# Patient Record
Sex: Female | Born: 1975 | Hispanic: No | Marital: Married
Health system: Northeastern US, Academic
[De-identification: ages and names within clinical notes are randomized; demographics above are authoritative.]

## PROBLEM LIST (undated history)

## (undated) DIAGNOSIS — I10 Essential (primary) hypertension: Secondary | ICD-10-CM

## (undated) HISTORY — PX: ABDOMINAL HYSTERECTOMY: SHX81

## (undated) HISTORY — PX: EYE SURGERY: SHX253

---

## 2014-03-14 ENCOUNTER — Emergency Department (HOSPITAL_BASED_OUTPATIENT_CLINIC_OR_DEPARTMENT_OTHER)
Admission: EM | Admit: 2014-03-14 | Discharge: 2014-03-14 | Disposition: A | Discharge status: Home / Self Care | Payer: Self-pay | Attending: Emergency Medicine | Admitting: Emergency Medicine

## 2014-03-14 ENCOUNTER — Encounter (HOSPITAL_BASED_OUTPATIENT_CLINIC_OR_DEPARTMENT_OTHER): Payer: Self-pay | Admitting: Emergency Medicine

## 2014-03-14 DIAGNOSIS — Y9301 Activity, walking, marching and hiking: Secondary | ICD-10-CM | POA: Insufficient documentation

## 2014-03-14 DIAGNOSIS — W268XXA Contact with other sharp object(s), not elsewhere classified, initial encounter: Secondary | ICD-10-CM | POA: Insufficient documentation

## 2014-03-14 DIAGNOSIS — Z79899 Other long term (current) drug therapy: Secondary | ICD-10-CM | POA: Insufficient documentation

## 2014-03-14 DIAGNOSIS — Y929 Unspecified place or not applicable: Secondary | ICD-10-CM | POA: Insufficient documentation

## 2014-03-14 DIAGNOSIS — S91309A Unspecified open wound, unspecified foot, initial encounter: Secondary | ICD-10-CM | POA: Insufficient documentation

## 2014-03-14 DIAGNOSIS — I10 Essential (primary) hypertension: Secondary | ICD-10-CM | POA: Insufficient documentation

## 2014-03-14 DIAGNOSIS — S91311A Laceration without foreign body, right foot, initial encounter: Secondary | ICD-10-CM

## 2014-03-14 HISTORY — DX: Essential (primary) hypertension: I10

## 2014-03-14 NOTE — Discharge Instructions (Signed)
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
## 2014-03-14 NOTE — ED Notes (Signed)
Pt foot cleaned and bandaged with bacitracin and 2x2

## 2014-03-24 NOTE — ED Provider Notes (Signed)
Medical screening examination/treatment/procedure(s) were performed by non-physician practitioner and as supervising physician I was immediately available for consultation/collaboration.   EKG Interpretation None        Rolland PorterMark Kanishk Stroebel, MD 03/24/14 2354

## 2014-08-29 ENCOUNTER — Emergency Department (HOSPITAL_COMMUNITY): Payer: Self-pay

## 2014-08-29 ENCOUNTER — Encounter (HOSPITAL_COMMUNITY): Payer: Self-pay | Admitting: Emergency Medicine

## 2014-08-29 ENCOUNTER — Emergency Department (HOSPITAL_COMMUNITY)
Admission: EM | Admit: 2014-08-29 | Discharge: 2014-08-30 | Disposition: A | Discharge status: Home / Self Care | Payer: Self-pay | Attending: Emergency Medicine | Admitting: Emergency Medicine

## 2014-08-29 DIAGNOSIS — F419 Anxiety disorder, unspecified: Secondary | ICD-10-CM | POA: Insufficient documentation

## 2014-08-29 DIAGNOSIS — R51 Headache: Secondary | ICD-10-CM | POA: Insufficient documentation

## 2014-08-29 DIAGNOSIS — I10 Essential (primary) hypertension: Secondary | ICD-10-CM | POA: Insufficient documentation

## 2014-08-29 DIAGNOSIS — Z79899 Other long term (current) drug therapy: Secondary | ICD-10-CM | POA: Insufficient documentation

## 2014-08-29 DIAGNOSIS — R519 Headache, unspecified: Secondary | ICD-10-CM

## 2014-08-29 LAB — CBG MONITORING, ED: Glucose-Capillary: 79 mg/dL (ref 70–99)

## 2014-08-29 MED ORDER — DIPHENHYDRAMINE HCL 50 MG/ML IJ SOLN
25.0000 mg | Freq: Once | INTRAMUSCULAR | Status: AC
  Administered 2014-08-29: 25 mg via INTRAVENOUS
  Filled 2014-08-29: qty 1

## 2014-08-29 MED ORDER — LISINOPRIL-HYDROCHLOROTHIAZIDE 10-12.5 MG PO TABS: 1.0000 | ORAL_TABLET | Freq: Every day | ORAL | Status: DC

## 2014-08-29 MED ORDER — SODIUM CHLORIDE 0.9 % IV BOLUS (SEPSIS)
1000.0000 mL | Freq: Once | INTRAVENOUS | Status: AC
  Administered 2014-08-29: 1000 mL via INTRAVENOUS

## 2014-08-29 MED ORDER — PROMETHAZINE HCL 25 MG/ML IJ SOLN
25.0000 mg | Freq: Once | INTRAMUSCULAR | Status: AC
  Administered 2014-08-29: 25 mg via INTRAVENOUS
  Filled 2014-08-29: qty 1

## 2014-08-29 MED ORDER — KETOROLAC TROMETHAMINE 30 MG/ML IJ SOLN
30.0000 mg | Freq: Once | INTRAMUSCULAR | Status: AC
  Administered 2014-08-29: 30 mg via INTRAVENOUS
  Filled 2014-08-29: qty 1

## 2014-08-29 NOTE — ED Notes (Signed)
Pt. reports persistent headache for 1 week with mild nausea and photophobia  , denies injury , alert and oriented .

## 2014-08-29 NOTE — ED Notes (Signed)
Pt states that she was diagnosed with muscle spasms [place], but has recently moved here and has not followed up. Pt states that she has been having muscle spasms in her neck x months. Pt states that the pain in her head is worse than normal. Pt also states that she has not taken her BP meds today.

## 2014-08-29 NOTE — ED Provider Notes (Signed)
CSN: [id number]     Arrival date & time 08/29/14  2055 History   First MD Initiated Contact with Patient 08/29/14 2104     Chief Complaint  Patient presents with  . Headache     (Consider location/radiation/quality/duration/timing/severity/associated sxs/prior Treatment) HPI   HPI: 38 y.o female w PMH of HTN, neck spasms, c/o headache and left sided neck pain. Pt reports b/l temporal, intermittent sharp pains in a band-like fashion around her head that is worse this week. Pt has been experiencing similar headaches for several months but with progressive increased frequency. Associated symptoms include tinnitus, photosensitivity, and possible aura described as sparkly white lights and blurred vision. Denies fevers, chills, syncope, recent trauma, hospitalizations. Reports left sided neck spasms since January 2015 for which she received steroid injections and PT with some relief. Pt recently moved and has been unable to see a family physician. She confided some increased stress in the last several months after leaving her husband with responsibility to care for two children. She requests refills on HCTZ-Lisinopril for HTN. She did not take her antihypertensive medicine today.    Past Medical History  Diagnosis Date  . Hypertension    Past Surgical History  Procedure Laterality Date  . Abdominal hysterectomy    . Eye surgery     No family history on file. History  Substance Use Topics  . Smoking status: Never Smoker   . Smokeless tobacco: Not on file  . Alcohol Use: No   OB History    No data available     Review of Systems  10 Systems reviewed and are negative for acute change except as noted in the HPI.    Allergies  Review of patient's allergies indicates no known allergies.  Home Medications   Prior to Admission medications   Medication Sig Start Date End Date Taking? Authorizing Provider  ibuprofen (ADVIL) 200 MG tablet Take 400 mg by mouth every 6 (six) hours as  needed for headache.   Yes Historical Provider, MD  lisinopril-hydrochlorothiazide (PRINZIDE,ZESTORETIC) 10-12.5 MG per tablet Take 1 tablet by mouth daily.   Yes Historical Provider, MD   BP 134/84 mmHg  Pulse 65  Temp(Src) 98 F (36.7 C) (Oral)  Resp 18  Ht 5\' 4"  (1.626 m)  Wt 145 lb (65.772 kg)  BMI 24.88 kg/m2  SpO2 97% Physical Exam  Constitutional: She appears well-developed and well-nourished. No distress.  HENT:  Head: Normocephalic and atraumatic.  Right Ear: Tympanic membrane and ear canal normal.  Left Ear: Tympanic membrane and ear canal normal.  Nose: Nose normal.  Mouth/Throat: Uvula is midline, oropharynx is clear and moist and mucous membranes are normal.  Eyes: Conjunctivae and lids are normal. Pupils are equal, round, and reactive to light.  Neck: Normal range of motion. Neck supple.  Cardiovascular: Normal rate and regular rhythm.   Pulmonary/Chest: Effort normal.  Abdominal: Soft.  Neurological: She is alert.  Cranial nerves II-VIII and X-XII evaluated and show no deficits. Pt alert and oriented x 3 Upper and lower extremity strength is symmetrical and physiologic Normal muscular tone No facial droop Coordination intact, no limb ataxia, finger-nose-finger normal Rapid alternating movements normal No pronator drift  Skin: Skin is warm and dry.  Nursing note and vitals reviewed.   ED Course  Procedures (including critical care time) Labs Review Labs Reviewed  CBG MONITORING, ED    Imaging Review No results found.   EKG Interpretation None      MDM   Final diagnoses:  Headache   Chronic tension-like headache with acute worsening x 1 week, h/o neck spasms. Denied trauma, fevers/chills, vomiting. Normal neurologic exam, no nuchal rigidity. Pt was very anxious and concerned about her headache and requests Head CT. A CT Head w/o contrast was ordered and revealed no acute intracranial abnormalities. Pt relieved and friend at bedside has copy of  results of CT scan. Migraine cocktail provided relief from pain and nausea. BP and HR improved during her stay. Will d/c with refill to HCTZ-Lisinopril 10-12.5mg  per pt request.   Medications  diphenhydrAMINE (BENADRYL) injection 25 mg (25 mg Intravenous Given 08/29/14 2300)  ketorolac (TORADOL) 30 MG/ML injection 30 mg (30 mg Intravenous Given 08/29/14 2303)  sodium chloride 0.9 % bolus 1,000 mL (1,000 mLs Intravenous New Bag/Given 08/29/14 2250)  promethazine (PHENERGAN) injection 25 mg (25 mg Intravenous Given 08/29/14 2257)   12:11am Benadryl and Phenergan has sedated patient. She was able to get up and ambulate to the bathroom but is sleepy. Vital signs remain stable. O2 in room is 100%, pulse is 80, BP is 134/84.  Will monitor for 1 more hour and discharge if vitals remain stable.  12: 38 am The patient denies any symptoms of neurological impairment or TIA's; no amaurosis, diplopia, dysphasia, or unilateral disturbance of motor or sensory function. No loss of balance or vertigo. Nurse ambulated pt with minimal assistance to bathroom  38 y.o.Bettina Christley's evaluation in the Emergency Department is complete. It has been determined that no acute conditions requiring further emergency intervention are present at this time. The patient/guardian have been advised of the diagnosis and plan. We have discussed signs and symptoms that warrant return to the ED, such as changes or worsening in symptoms.  Vital signs are stable at discharge. Filed Vitals:   08/29/14 2230  BP: 134/84  Pulse: 65  Temp:   Resp:     Patient/guardian has voiced understanding and agreed to follow-up with the PCP or specialist.   Dorthula Matasiffany G Dominico Rod, PA-C 08/30/14 [phone]  Elwin MochaBlair Walden, MD 08/30/14 [phone]

## 2016-04-14 ENCOUNTER — Emergency Department (HOSPITAL_COMMUNITY): Payer: BLUE CROSS/BLUE SHIELD

## 2016-04-14 ENCOUNTER — Emergency Department (HOSPITAL_COMMUNITY)
Admission: EM | Admit: 2016-04-14 | Discharge: 2016-04-14 | Disposition: A | Discharge status: Home / Self Care | Payer: BLUE CROSS/BLUE SHIELD | Attending: Emergency Medicine | Admitting: Emergency Medicine

## 2016-04-14 ENCOUNTER — Encounter (HOSPITAL_COMMUNITY): Payer: Self-pay | Admitting: Emergency Medicine

## 2016-04-14 DIAGNOSIS — R51 Headache: Secondary | ICD-10-CM | POA: Diagnosis not present

## 2016-04-14 DIAGNOSIS — Z79899 Other long term (current) drug therapy: Secondary | ICD-10-CM | POA: Diagnosis not present

## 2016-04-14 DIAGNOSIS — I10 Essential (primary) hypertension: Secondary | ICD-10-CM | POA: Diagnosis not present

## 2016-04-14 DIAGNOSIS — R519 Headache, unspecified: Secondary | ICD-10-CM

## 2016-04-14 LAB — CBC WITH DIFFERENTIAL/PLATELET
BASOS PCT: 1 %
Basophils Absolute: 0 10*3/uL (ref 0.0–0.1)
Eosinophils Absolute: 0.1 10*3/uL (ref 0.0–0.7)
Eosinophils Relative: 1 %
HCT: 41.5 % (ref 36.0–46.0)
HEMOGLOBIN: 13.8 g/dL (ref 12.0–15.0)
LYMPHS ABS: 2.2 10*3/uL (ref 0.7–4.0)
Lymphocytes Relative: 33 %
MCH: 28.7 pg (ref 26.0–34.0)
MCHC: 33.3 g/dL (ref 30.0–36.0)
MCV: 86.3 fL (ref 78.0–100.0)
Monocytes Absolute: 0.5 10*3/uL (ref 0.1–1.0)
Monocytes Relative: 7 %
NEUTROS PCT: 58 %
Neutro Abs: 3.8 10*3/uL (ref 1.7–7.7)
Platelets: 247 10*3/uL (ref 150–400)
RBC: 4.81 MIL/uL (ref 3.87–5.11)
RDW: 13 % (ref 11.5–15.5)
WBC: 6.7 10*3/uL (ref 4.0–10.5)

## 2016-04-14 LAB — BASIC METABOLIC PANEL
Anion gap: 8 (ref 5–15)
BUN: 12 mg/dL (ref 6–20)
CHLORIDE: 99 mmol/L — AB (ref 101–111)
CO2: 30 mmol/L (ref 22–32)
CREATININE: 0.92 mg/dL (ref 0.44–1.00)
Calcium: 9.3 mg/dL (ref 8.9–10.3)
GFR calc non Af Amer: >60 mL/min (ref >60)
Glucose, Bld: 113 mg/dL — ABNORMAL HIGH (ref 65–99)
POTASSIUM: 3 mmol/L — AB (ref 3.5–5.1)
SODIUM: 137 mmol/L (ref 135–145)

## 2016-04-14 MED ORDER — KETOROLAC TROMETHAMINE 30 MG/ML IJ SOLN
30.0000 mg | Freq: Once | INTRAMUSCULAR | Status: AC
  Administered 2016-04-14: 30 mg via INTRAVENOUS
  Filled 2016-04-14: qty 1

## 2016-04-14 MED ORDER — TRAMADOL HCL 50 MG PO TABS: 50.0000 mg | ORAL_TABLET | Freq: Four times a day (QID) | ORAL | Status: DC | PRN

## 2016-04-14 MED ORDER — METOCLOPRAMIDE HCL 5 MG/ML IJ SOLN
10.0000 mg | Freq: Once | INTRAMUSCULAR | Status: AC
  Administered 2016-04-14: 10 mg via INTRAVENOUS
  Filled 2016-04-14: qty 2

## 2016-04-14 MED ORDER — DIPHENHYDRAMINE HCL 50 MG/ML IJ SOLN
25.0000 mg | Freq: Once | INTRAMUSCULAR | Status: AC
  Administered 2016-04-14: 25 mg via INTRAVENOUS
  Filled 2016-04-14: qty 1

## 2016-04-14 MED ORDER — TRAMADOL HCL 50 MG PO TABS
50.0000 mg | ORAL_TABLET | Freq: Four times a day (QID) | ORAL | Status: DC | PRN
Start: 2016-04-14 — End: 2018-06-09

## 2016-04-14 NOTE — ED Notes (Signed)
Pt states she continues to feel the pressure of her headache.  Rates pain 5/10.  Awaiting CT scan.

## 2016-04-14 NOTE — Discharge Instructions (Signed)
Follow up with your family md next week for recheck °

## 2016-04-14 NOTE — ED Notes (Signed)
Has had right sided headache for two weeks constant which waxes and wanes.  Has HTN, working on changing meds.  Also is taking amoxil for right otitis.  + photosensitive.  + nausea

## 2016-04-14 NOTE — ED Provider Notes (Signed)
CSN: [id number]     Arrival date & time 04/14/16  0903 History   First MD Initiated Contact with Patient 04/14/16 [phone]     Chief Complaint  Patient presents with  . Headache     (Consider location/radiation/quality/duration/timing/severity/associated sxs/prior Treatment) Patient is a 40 y.o. female presenting with headaches. The history is provided by the patient (Patient complains of a headache. And she is on medicine for blood pressure).  Headache Pain location:  Frontal Quality:  Dull Radiates to:  Does not radiate Severity currently:  7/10 Severity at highest:  9/10 Onset quality:  Sudden Timing:  Constant Progression:  Worsening Chronicity:  Recurrent Context: not activity   Associated symptoms: no abdominal pain, no back pain, no congestion, no cough, no diarrhea, no fatigue, no seizures and no sinus pressure     Past Medical History  Diagnosis Date  . Hypertension    Past Surgical History  Procedure Laterality Date  . Abdominal hysterectomy    . Eye surgery     History reviewed. No pertinent family history. Social History  Substance Use Topics  . Smoking status: Never Smoker   . Smokeless tobacco: None  . Alcohol Use: Yes     Comment: occasional-1-2 every 2 weeks   OB History    No data available     Review of Systems  Constitutional: Negative for appetite change and fatigue.  HENT: Negative for congestion, ear discharge and sinus pressure.   Eyes: Negative for discharge.  Respiratory: Negative for cough.   Cardiovascular: Negative for chest pain.  Gastrointestinal: Negative for abdominal pain and diarrhea.  Genitourinary: Negative for frequency and hematuria.  Musculoskeletal: Negative for back pain.  Skin: Negative for rash.  Neurological: Positive for headaches. Negative for seizures.  Psychiatric/Behavioral: Negative for hallucinations.      Allergies  Review of patient's allergies indicates no known allergies.  Home Medications   Prior to  Admission medications   Medication Sig Start Date End Date Taking? Authorizing Provider  ibuprofen (ADVIL) 200 MG tablet Take 400 mg by mouth every 6 (six) hours as needed for headache.    Historical Provider, MD  lisinopril-hydrochlorothiazide (PRINZIDE,ZESTORETIC) 10-12.5 MG per tablet Take 1 tablet by mouth daily. 08/29/14   Marlon Peliffany Greene, PA-C  traMADol (ULTRAM) 50 MG tablet Take 1 tablet (50 mg total) by mouth every 6 (six) hours as needed. 04/14/16   Bethann BerkshireJoseph Mayco Walrond, MD   BP 145/100 mmHg  Pulse 70  Temp(Src) 99 F (37.2 C) (Oral)  Resp 18  Wt 165 lb (74.844 kg)  SpO2 100% Physical Exam  Constitutional: She is oriented to person, place, and time. She appears well-developed.  HENT:  Head: Normocephalic.  Eyes: Conjunctivae and EOM are normal. No scleral icterus.  Neck: Neck supple. No thyromegaly present.  Cardiovascular: Normal rate and regular rhythm.  Exam reveals no gallop and no friction rub.   No murmur heard. Pulmonary/Chest: No stridor. She has no wheezes. She has no rales. She exhibits no tenderness.  Abdominal: She exhibits no distension. There is no tenderness. There is no rebound.  Musculoskeletal: Normal range of motion. She exhibits no edema.  Lymphadenopathy:    She has no cervical adenopathy.  Neurological: She is oriented to person, place, and time. She exhibits normal muscle tone. Coordination normal.  Skin: No rash noted. No erythema.  Psychiatric: She has a normal mood and affect. Her behavior is normal.    ED Course  Procedures (including critical care time) Labs Review Labs Reviewed  BASIC METABOLIC  PANEL - Abnormal; Notable for the following:    Potassium 3.0 (*)    Chloride 99 (*)    Glucose, Bld 113 (*)    All other components within normal limits  CBC WITH DIFFERENTIAL/PLATELET    Imaging Review Ct Head Wo Contrast  04/14/2016  CLINICAL DATA:  Headaches and nausea for 2 weeks, initial encounter EXAM: CT HEAD WITHOUT CONTRAST TECHNIQUE: Contiguous  axial images were obtained from the base of the skull through the vertex without intravenous contrast. COMPARISON:  08/29/2014 FINDINGS: The bony calvarium is intact. The ventricles are of normal size and configuration. No findings to suggest acute hemorrhage, acute infarction or space-occupying mass lesion are noted. IMPRESSION: No acute intracranial abnormality noted. Electronically Signed   By: Alcide CleverMark  Lukens M.D.   On: 04/14/2016 12:49   I have personally reviewed and evaluated these images and lab results as part of my medical decision-making.   EKG Interpretation None      MDM   Final diagnoses:  Headache disorder  Essential hypertension    Labs and CT scan unremarkable. Patient improved with treatment. Patient blood pressure remained moderately elevated. She will follow-up with her family doctor next week for recheck. Patient given some Ultram for discomfort    Bethann BerkshireJoseph Kieth Hartis, MD 04/14/16 1354

## 2016-08-10 ENCOUNTER — Other Ambulatory Visit: Payer: Self-pay

## 2016-08-10 DIAGNOSIS — N644 Mastodynia: Secondary | ICD-10-CM

## 2018-03-12 ENCOUNTER — Encounter (HOSPITAL_COMMUNITY): Payer: Self-pay | Admitting: Emergency Medicine

## 2018-03-12 ENCOUNTER — Emergency Department (HOSPITAL_COMMUNITY)
Admission: EM | Admit: 2018-03-12 | Discharge: 2018-03-12 | Disposition: A | Discharge status: Home / Self Care | Payer: BLUE CROSS/BLUE SHIELD | Attending: Emergency Medicine | Admitting: Emergency Medicine

## 2018-03-12 ENCOUNTER — Other Ambulatory Visit: Payer: Self-pay

## 2018-03-12 DIAGNOSIS — M545 Low back pain, unspecified: Secondary | ICD-10-CM

## 2018-03-12 DIAGNOSIS — I10 Essential (primary) hypertension: Secondary | ICD-10-CM | POA: Insufficient documentation

## 2018-03-12 DIAGNOSIS — G8929 Other chronic pain: Secondary | ICD-10-CM

## 2018-03-12 DIAGNOSIS — Z79899 Other long term (current) drug therapy: Secondary | ICD-10-CM | POA: Insufficient documentation

## 2018-03-12 MED ORDER — NAPROXEN 500 MG PO TABS: 500.0000 mg | ORAL_TABLET | Freq: Two times a day (BID) | ORAL | 0 refills | Status: AC

## 2018-03-12 MED ORDER — CYCLOBENZAPRINE HCL 5 MG PO TABS: 5.0000 mg | ORAL_TABLET | Freq: Three times a day (TID) | ORAL | 0 refills | Status: AC

## 2018-03-12 MED ORDER — CYCLOBENZAPRINE HCL 10 MG PO TABS
5.0000 mg | ORAL_TABLET | Freq: Once | ORAL | Status: AC
  Administered 2018-03-12: 5 mg via ORAL
  Filled 2018-03-12: qty 1

## 2018-03-12 NOTE — ED Triage Notes (Signed)
Had a fall in 2016 and yhesterday she started to have back pain, pain comes and goes no other I njury

## 2018-03-12 NOTE — ED Provider Notes (Signed)
[facility] EMERGENCY DEPARTMENT Provider Note  CSN: [id number] Arrival date & time: 03/12/18  1316  History   Chief Complaint Chief Complaint  Patient presents with  . Back Pain    HPI Tracie Leach is a 42 y.o. female with a medical history of HTN and low back pain who presented to the ED for back pain. Patient states she has had back pain since 2016 when she fell on her back in a grocery store. There has not been any acute changes in her back pain since then, but came to the ED to see if there was any medication that could assist in pain alleviation. Denies recent trauma or injury. She describes the pain as persistent, tight and aching. She also endorses neck pain, but it does not radiate anywhere else. Patient states that she has tried Tylenol and Naproxen in the past which has alleviated the pain, but has not used it recently. Denies fatigue, fever, arthralgias, myalgias, paresthesias, weakness, bowel or bladder dysfunction or issues with gait, balance and coordination.  Past Medical History:  Diagnosis Date  . Hypertension     There are no active problems to display for this patient.   Past Surgical History:  Procedure Laterality Date  . ABDOMINAL HYSTERECTOMY    . EYE SURGERY       OB History   None    Home Medications    Prior to Admission medications   Medication Sig Start Date End Date Taking? Authorizing Provider  cyclobenzaprine (FLEXERIL) 5 MG tablet Take 1 tablet (5 mg total) by mouth 3 (three) times daily for 15 days. 03/12/18 03/27/18  Joell Buerger, Jerrel Ivory I, PA-C  ibuprofen (ADVIL) 200 MG tablet Take 400 mg by mouth every 6 (six) hours as needed for headache.    [provider]  lisinopril-hydrochlorothiazide (PRINZIDE,ZESTORETIC) 10-12.5 MG per tablet Take 1 tablet by mouth daily. 08/29/14   Marlon Pel, PA-C  naproxen (NAPROSYN) 500 MG tablet Take 1 tablet (500 mg total) by mouth 2 (two) times daily. 03/12/18 04/11/18  Andres Escandon, Jerrel Ivory  I, PA-C  traMADol (ULTRAM) 50 MG tablet Take 1 tablet (50 mg total) by mouth every 6 (six) hours as needed. 04/14/16   Bethann Berkshire, MD  traMADol (ULTRAM) 50 MG tablet Take 1 tablet (50 mg total) by mouth every 6 (six) hours as needed. 04/14/16   Bethann Berkshire, MD    Family History No family history on file.  Social History Social History   Tobacco Use  . Smoking status: Never Smoker  . Smokeless tobacco: Never Used  Substance Use Topics  . Alcohol use: Yes    Comment: occasional-1-2 every 2 weeks  . Drug use: No     Allergies   Patient has no known allergies.   Review of Systems Review of Systems  Constitutional: Negative.  Negative for fatigue and fever.  Gastrointestinal: Negative.   Genitourinary: Negative.   Musculoskeletal: Positive for back pain and neck pain. Negative for arthralgias, gait problem, joint swelling, myalgias and neck stiffness.  Skin: Negative.  Negative for wound.  Neurological: Negative for tremors, weakness and numbness.     Physical Exam Updated Vital Signs BP 140/90 (BP Location: Right Arm)   Pulse 69   Temp 98.4 F (36.9 C) (Oral)   Resp 16   SpO2 100%   Physical Exam  Constitutional: She appears well-developed and well-nourished.  Neck: Normal range of motion. Neck supple.  Cardiovascular: Normal rate, regular rhythm, normal heart sounds and intact distal pulses.  Pulmonary/Chest: Effort normal and breath sounds normal.  Musculoskeletal:       Cervical back: She exhibits tenderness. She exhibits no bony tenderness, no deformity, no pain and no spasm.       Thoracic back: Normal.       Lumbar back: She exhibits tenderness. She exhibits normal range of motion, no bony tenderness and no deformity.  Paraspinal muscles of cervical and lumbar regions are tender to palpation. Patient has full active and passive ROM of neck, back and all 4 extremities.  Neurological: She is alert. She has normal strength and normal reflexes. No sensory  deficit. She exhibits normal muscle tone. Coordination and gait normal.  5/5 strength in upper and lower extremities bilaterally.  Nursing note and vitals reviewed.    ED Treatments / Results  Labs (all labs ordered are listed, but only abnormal results are displayed) Labs Reviewed - No data to display  EKG None  Radiology No results found.  Procedures Procedures (including critical care time)  Medications Ordered in ED Medications  cyclobenzaprine (FLEXERIL) tablet 5 mg (has no administration in time range)   Initial Impression / Assessment and Plan / ED Course  Triage vital signs and the nursing notes have been reviewed.  Pertinent labs & imaging results that were available during care of the patient were reviewed and considered in medical decision making (see chart for details).    Patient presents for chronic low back pain. She is in no acute distress, but appears uncomfortable throughout the exam. She has had no acute changes in the quality or severity of her back pain since an initial fall in 2016. No recent trauma or injury that would warrant imaging today. Denies systemic s/s or risk factors that would suggest an infectious process. Physical exam is reassuring as she has full active and passive ROM in neck, back and extremities without bony tenderness. She is able to ambulate and bear weight without issue. It is also reassuring that this pain is relieved with Tylenol and Naprosyn and patient does not have any neuro symptoms like paresthesias or weakness.   Final Clinical Impressions(s) / ED Diagnoses  1. Chronic Low Back Pain. MSK etiology. Flexeril 5mg  Q6H PRN and Naprosyn Referral to neurosurgery. Education provided on pharm and non-pharm treatment options.   Dispo: Home. After thorough clinical evaluation, this patient is determined to be medically stable and can be safely discharged with the previously mentioned treatment and/or outpatient follow-up/referral(s). At this  time, there are no other apparent medical conditions that require further screening, evaluation or treatment.  Final diagnoses:  Chronic low back pain without sciatica, unspecified back pain laterality    ED Discharge Orders        Ordered    cyclobenzaprine (FLEXERIL) 5 MG tablet  3 times daily     03/12/18 1554    naproxen (NAPROSYN) 500 MG tablet  2 times daily     03/12/18 1554       Virgina Deakins, [place], PA-C 03/12/18 1556    Tilden Fossaees, Elizabeth, MD 03/13/18 [phone]

## 2018-03-12 NOTE — Discharge Instructions (Signed)
Call neurosurgeon listed below to schedule an appointment.

## 2018-06-08 ENCOUNTER — Emergency Department (HOSPITAL_COMMUNITY): Payer: Self-pay

## 2018-06-08 ENCOUNTER — Other Ambulatory Visit: Payer: Self-pay

## 2018-06-08 ENCOUNTER — Emergency Department (HOSPITAL_COMMUNITY)
Admission: EM | Admit: 2018-06-08 | Discharge: 2018-06-09 | Disposition: A | Discharge status: Home / Self Care | Payer: Self-pay | Attending: Emergency Medicine | Admitting: Emergency Medicine

## 2018-06-08 ENCOUNTER — Encounter (HOSPITAL_COMMUNITY): Payer: Self-pay | Admitting: Emergency Medicine

## 2018-06-08 DIAGNOSIS — Z79899 Other long term (current) drug therapy: Secondary | ICD-10-CM | POA: Insufficient documentation

## 2018-06-08 DIAGNOSIS — K5900 Constipation, unspecified: Secondary | ICD-10-CM | POA: Insufficient documentation

## 2018-06-08 DIAGNOSIS — R1032 Left lower quadrant pain: Secondary | ICD-10-CM

## 2018-06-08 DIAGNOSIS — I10 Essential (primary) hypertension: Secondary | ICD-10-CM | POA: Insufficient documentation

## 2018-06-08 LAB — URINALYSIS, ROUTINE W REFLEX MICROSCOPIC
Bilirubin Urine: NEGATIVE
GLUCOSE, UA: NEGATIVE mg/dL
Hgb urine dipstick: NEGATIVE
Ketones, ur: NEGATIVE mg/dL
Nitrite: NEGATIVE
Protein, ur: NEGATIVE mg/dL
SPECIFIC GRAVITY, URINE: 1.023 (ref 1.005–1.030)
pH: 6 (ref 5.0–8.0)

## 2018-06-08 LAB — COMPREHENSIVE METABOLIC PANEL
ALT: 16 U/L (ref 0–44)
AST: 21 U/L (ref 15–41)
Albumin: 4.2 g/dL (ref 3.5–5.0)
Alkaline Phosphatase: 68 U/L (ref 38–126)
Anion gap: 11 (ref 5–15)
BUN: 12 mg/dL (ref 6–20)
CO2: 28 mmol/L (ref 22–32)
CREATININE: 0.92 mg/dL (ref 0.44–1.00)
Calcium: 9.7 mg/dL (ref 8.9–10.3)
Chloride: 99 mmol/L (ref 98–111)
Glucose, Bld: 100 mg/dL — ABNORMAL HIGH (ref 70–99)
Potassium: 3.7 mmol/L (ref 3.5–5.1)
Sodium: 138 mmol/L (ref 135–145)
Total Bilirubin: 0.7 mg/dL (ref 0.3–1.2)
Total Protein: 7.3 g/dL (ref 6.5–8.1)

## 2018-06-08 LAB — CBC
HCT: 40.4 % (ref 36.0–46.0)
Hemoglobin: 13.2 g/dL (ref 12.0–15.0)
MCH: 28.6 pg (ref 26.0–34.0)
MCHC: 32.7 g/dL (ref 30.0–36.0)
MCV: 87.4 fL (ref 78.0–100.0)
Platelets: 291 10*3/uL (ref 150–400)
RBC: 4.62 MIL/uL (ref 3.87–5.11)
RDW: 13.1 % (ref 11.5–15.5)
WBC: 6.4 10*3/uL (ref 4.0–10.5)

## 2018-06-08 LAB — LIPASE, BLOOD: Lipase: 36 U/L (ref 11–51)

## 2018-06-08 MED ORDER — IOPAMIDOL (ISOVUE-300) INJECTION 61%
INTRAVENOUS | Status: AC
  Filled 2018-06-08: qty 100

## 2018-06-08 MED ORDER — IOPAMIDOL (ISOVUE-300) INJECTION 61%
100.0000 mL | Freq: Once | INTRAVENOUS | Status: AC | PRN
  Administered 2018-06-08: 100 mL via INTRAVENOUS

## 2018-06-08 NOTE — ED Notes (Signed)
Patient transported to CT 

## 2018-06-08 NOTE — ED Triage Notes (Signed)
C/o generalized abd pain, abd bloating/distention, and bilateral flank pain x 2 weeks.  Pain worse today.   Intermittent constipation x 1 month.  States she used suppositories and had small "pellets" this morning.  Reports more tenderness to LLQ.  Denies urinary symptoms.

## 2018-06-08 NOTE — ED Notes (Signed)
No answer when called for vitals. 

## 2018-06-09 MED ORDER — LACTULOSE 10 GM/15ML PO SOLN
20.0000 g | Freq: Once | ORAL | Status: AC
  Administered 2018-06-09: 20 g via ORAL
  Filled 2018-06-09: qty 30

## 2018-06-09 MED ORDER — POLYETHYLENE GLYCOL 3350 17 G PO PACK: 17.0000 g | PACK | Freq: Two times a day (BID) | ORAL | 0 refills | Status: DC

## 2018-06-09 MED ORDER — MILK AND MOLASSES ENEMA
1.0000 | Freq: Once | RECTAL | Status: AC
  Administered 2018-06-09: 250 mL via RECTAL
  Filled 2018-06-09 (×2): qty 250

## 2018-06-09 MED ORDER — BISACODYL 5 MG PO TBEC
10.0000 mg | DELAYED_RELEASE_TABLET | Freq: Once | ORAL | Status: AC
  Administered 2018-06-09: 10 mg via ORAL
  Filled 2018-06-09: qty 2

## 2018-06-09 NOTE — Discharge Instructions (Signed)
1. Medications: Miralax, usual home medications 2. Treatment: rest, drink plenty of fluids, advance diet slowly 3. Follow Up: Please followup with your primary doctor in 2-3 days for discussion of your diagnoses and further evaluation after today's visit; if you do not have a primary care doctor use the resource guide provided to find one; Please return to the ER for persistent vomiting, high fevers or worsening symptoms

## 2018-06-09 NOTE — ED Provider Notes (Signed)
[facility] EMERGENCY DEPARTMENT Provider Note   CSN: [id number] Arrival date & time: 06/08/18  2011     History   Chief Complaint Chief Complaint  Patient presents with  . Abdominal Pain  . Flank Pain    HPI Tracie Leach is a 42 y.o. female with a hx of HTN presents to the Emergency Department complaining of gradual, persistent, progressively worsening LLQ abd pain and constipation onset 1 mo ago, but worsening in the last few days. Associated symptoms include left flank and low back.  Walking makes the pain worse.  Nothing seems to make it better.  Pt reports she has been constipated x1 month.  Pt reports she regularly has a normal BM 2x everyday.  Pt reports after she became constipated, she began drinking tea which did not help.  Pt reports she is straining to have a BM every few days.  She reports small, hard pellets.  Denies melena or hematochezia.  Pt reports using glycerine suppository 3 days ago without significant relief. She has not attempted any stool softener or laxative.  Pt reports her abd is distended, worsening over the last few days.  Pt reports the LLQ abd pain began 3 days ago and worsened today. Pt denies fever, headache, neck pain, chest pain, SOB, N/V/D, weakness, syncope, dysuria.  Pt reports some chills.  She reports an 8 lb weight loss over 2 weeks within the last month, but was attempting to lose the weight.  She denies night sweats.  Pt reports previous abd hysterectomy She denies international travel or sick contacts. No medication changes recently.  Denies opiate usage.   The history is provided by the patient, medical records and a relative. No language interpreter was used.    Past Medical History:  Diagnosis Date  . Hypertension     There are no active problems to display for this patient.   Past Surgical History:  Procedure Laterality Date  . ABDOMINAL HYSTERECTOMY    . EYE SURGERY       OB History   None      Home  Medications    Prior to Admission medications   Medication Sig Start Date End Date Taking? Authorizing Provider  chlorthalidone (HYGROTON) 25 MG tablet Take 25 mg by mouth daily.   Yes [provider]  hydrochlorothiazide (HYDRODIURIL) 25 MG tablet Take 12.5 mg by mouth daily.   Yes [provider]  ibuprofen (ADVIL) 200 MG tablet Take 400 mg by mouth every 6 (six) hours as needed for headache.   Yes [provider]  Multiple Vitamin (MULTIVITAMIN WITH MINERALS) TABS tablet Take 1 tablet by mouth daily.   Yes [provider]  Multiple Vitamins-Minerals (HAIR SKIN AND NAILS FORMULA PO) Take 2 tablets by mouth daily.   Yes [provider]  Vitamin D, Ergocalciferol, (DRISDOL) 50000 units CAPS capsule Take 50,000 Units by mouth every 7 (seven) days.   Yes [provider]  lisinopril-hydrochlorothiazide (PRINZIDE,ZESTORETIC) 10-12.5 MG per tablet Take 1 tablet by mouth daily. Patient not taking: Reported on 06/09/2018 08/29/14   Marlon Pel, PA-C  polyethylene glycol [facility] / Ethelene Hal) packet Take 17 g by mouth 2 (two) times daily. 06/09/18   Isabeau Mccalla, Dahlia Client, PA-C    Family History No family history on file.  Social History Social History   Tobacco Use  . Smoking status: Never Smoker  . Smokeless tobacco: Never Used  Substance Use Topics  . Alcohol use: Yes    Comment: occasional-1-2 every 2  weeks  . Drug use: No     Allergies   Patient has no known allergies.   Review of Systems Review of Systems  Constitutional: Negative for appetite change, diaphoresis, fatigue, fever and unexpected weight change.  HENT: Negative for mouth sores.   Eyes: Negative for visual disturbance.  Respiratory: Negative for cough, chest tightness, shortness of breath and wheezing.   Cardiovascular: Negative for chest pain.  Gastrointestinal: Positive for abdominal pain and constipation. Negative for diarrhea, nausea and vomiting.  Endocrine:  Negative for polydipsia, polyphagia and polyuria.  Genitourinary: Positive for flank pain. Negative for dysuria, frequency, hematuria and urgency.  Musculoskeletal: Positive for back pain. Negative for neck stiffness.  Skin: Negative for rash.  Allergic/Immunologic: Negative for immunocompromised state.  Neurological: Negative for syncope, light-headedness and headaches.  Hematological: Does not bruise/bleed easily.  Psychiatric/Behavioral: Negative for sleep disturbance. The patient is not nervous/anxious.      Physical Exam Updated Vital Signs BP (!) 156/99   Pulse 69   Temp 98.7 F (37.1 C) (Oral)   Resp 16   Ht 5\' 4"  (1.626 m)   Wt 83.9 kg   SpO2 100%   BMI 31.76 kg/m   Physical Exam  Constitutional: She appears well-developed and well-nourished. No distress.  Awake, alert, nontoxic appearance  HENT:  Head: Normocephalic and atraumatic.  Mouth/Throat: Oropharynx is clear and moist. No oropharyngeal exudate.  Eyes: Conjunctivae are normal. No scleral icterus.  Neck: Normal range of motion. Neck supple.  Cardiovascular: Normal rate, regular rhythm and intact distal pulses.  Pulmonary/Chest: Effort normal and breath sounds normal. No respiratory distress. She has no wheezes.  Equal chest expansion  Abdominal: Soft. Bowel sounds are normal. She exhibits distension. She exhibits no mass. There is tenderness in the left lower quadrant. There is CVA tenderness (left, mild). There is no rigidity, no rebound and no guarding.  Musculoskeletal: Normal range of motion. She exhibits no edema.  Neurological: She is alert.  Speech is clear and goal oriented Moves extremities without ataxia  Skin: Skin is warm and dry. She is not diaphoretic.  Psychiatric: She has a normal mood and affect.  Nursing note and vitals reviewed.    ED Treatments / Results  Labs (all labs ordered are listed, but only abnormal results are displayed) Labs Reviewed  COMPREHENSIVE METABOLIC PANEL -  Abnormal; Notable for the following components:      Result Value   Glucose, Bld 100 (*)    All other components within normal limits  URINALYSIS, ROUTINE W REFLEX MICROSCOPIC - Abnormal; Notable for the following components:   Leukocytes, UA TRACE (*)    Bacteria, UA RARE (*)    All other components within normal limits  LIPASE, BLOOD  CBC     Radiology Ct Abdomen Pelvis W Contrast  Result Date: 06/09/2018 CLINICAL DATA:  Patient with left lower quadrant pain radiating to the back. EXAM: CT ABDOMEN AND PELVIS WITH CONTRAST TECHNIQUE: Multidetector CT imaging of the abdomen and pelvis was performed using the standard protocol following bolus administration of intravenous contrast. CONTRAST:  ISOVUE-300 IOPAMIDOL (ISOVUE-300) INJECTION 61% COMPARISON:  None. FINDINGS: Lower chest: Normal heart size. Dependent atelectasis within the bilateral lower lobes. No pleural effusion. Hepatobiliary: Liver is normal in size and contour. No focal lesion identified. Gallbladder is unremarkable. No intrahepatic or extrahepatic biliary ductal dilatation. Pancreas: Unremarkable Spleen: Unremarkable Adrenals/Urinary Tract: Normal adrenal glands. Kidneys enhance symmetrically with contrast. No hydronephrosis. Urinary bladder is unremarkable. Stomach/Bowel: Normal morphology of the stomach. No evidence  for bowel obstruction. No free intraperitoneal air. Small amount of free fluid in the pelvis. Vascular/Lymphatic: Normal caliber abdominal aorta. No retroperitoneal lymphadenopathy. Reproductive: Status post hysterectomy. Adnexal structures unremarkable. Other: None. Musculoskeletal: No aggressive or acute appearing osseous lesions. IMPRESSION: No acute process within the abdomen or pelvis. Electronically Signed   By: Annia Belt M.D.   On: 06/09/2018 00:17    Procedures Procedures (including critical care time)  Medications Ordered in ED Medications  iopamidol (ISOVUE-300) 61 % injection (has no  administration in time range)  iopamidol (ISOVUE-300) 61 % injection 100 mL (100 mLs Intravenous Contrast Given 06/08/18 2347)  milk and molasses enema (250 mLs Rectal Given 06/09/18 0132)  lactulose (CHRONULAC) 10 GM/15ML solution 20 g (20 g Oral Given 06/09/18 0132)  bisacodyl (DULCOLAX) EC tablet 10 mg (10 mg Oral Given 06/09/18 0132)     Initial Impression / Assessment and Plan / ED Course  I have reviewed the triage vital signs and the nursing notes.  Pertinent labs & imaging results that were available during my care of the patient were reviewed by me and considered in my medical decision making (see chart for details).  Clinical Course as of Jun 09 238  Sun Jun 09, 2018  0104 Normal  WBC: 6.4 [HM]  0104 No UTI  Nitrite: NEGATIVE [HM]  0105 Normal  Creatinine: 0.92 [HM]  0105 Afebrile  Temp: 98.7 F (37.1 C) [HM]  0105 No tachycardia  Pulse Rate: 69 [HM]  0105 Blood pressure improved without intervention.  BP: 130/80 [HM]    Clinical Course User Index [HM] Tamecca Artiga, Dahlia Client, PA-C    Presents with worsening constipation, abdominal distention, left lower quadrant and left back pain over the last month.  She has tried heat and glycerin without relief of her constipation.  She has not attempted any other medications.  On exam her abdomen is distended and she is tender in the left lower quadrant.  No rebound or guarding.  CT scan is without acute abnormality including no evidence of colonic lesion and no bowel obstruction.  I personally evaluated these images.  Patient does seem to have a large stool burden.  She reports she is passing gas and is passing some stool but it is small and hard.  Will give enema here in the emergency department and reassess.  labs are reassuring and there is no clinical evidence of diverticulitis.  2:38 AM Pt has had numerous, nonbloody stools here in the ED.  She reports feeling much better with resolution of her pain. Her back pain has resolved as  well. On repeat exam distention has improved, abd is soft and nontender.  No rebound or guarding.  Pt to be d/c home. She is to f/u with her PCP this week and return for new or worsening symptoms.    Final Clinical Impressions(s) / ED Diagnoses   Final diagnoses:  Constipation, unspecified constipation type  Left lower quadrant pain    ED Discharge Orders         Ordered    polyethylene glycol (MIRALAX / GLYCOLAX) packet  2 times daily     06/09/18 0100           Corde Antonini, Dahlia Client, PA-C 06/09/18 0240    Cardama, Amadeo Garnet, MD 06/09/18 [phone]

## 2018-06-12 ENCOUNTER — Other Ambulatory Visit: Payer: Self-pay

## 2018-06-12 ENCOUNTER — Encounter: Payer: Self-pay | Admitting: Gastroenterology

## 2018-06-12 ENCOUNTER — Encounter (HOSPITAL_COMMUNITY): Payer: Self-pay

## 2018-06-12 ENCOUNTER — Emergency Department (HOSPITAL_COMMUNITY)
Admission: EM | Admit: 2018-06-12 | Discharge: 2018-06-12 | Disposition: A | Discharge status: Home Health | Payer: Self-pay | Attending: Emergency Medicine | Admitting: Emergency Medicine

## 2018-06-12 DIAGNOSIS — R1084 Generalized abdominal pain: Secondary | ICD-10-CM | POA: Insufficient documentation

## 2018-06-12 DIAGNOSIS — K5900 Constipation, unspecified: Secondary | ICD-10-CM | POA: Insufficient documentation

## 2018-06-12 DIAGNOSIS — Z79899 Other long term (current) drug therapy: Secondary | ICD-10-CM | POA: Insufficient documentation

## 2018-06-12 DIAGNOSIS — I10 Essential (primary) hypertension: Secondary | ICD-10-CM | POA: Insufficient documentation

## 2018-06-12 LAB — COMPREHENSIVE METABOLIC PANEL
ALT: 15 U/L (ref 0–44)
ANION GAP: 11 (ref 5–15)
AST: 18 U/L (ref 15–41)
Albumin: 4.5 g/dL (ref 3.5–5.0)
Alkaline Phosphatase: 74 U/L (ref 38–126)
BILIRUBIN TOTAL: 0.4 mg/dL (ref 0.3–1.2)
BUN: 7 mg/dL (ref 6–20)
CALCIUM: 9.6 mg/dL (ref 8.9–10.3)
CO2: 28 mmol/L (ref 22–32)
Chloride: 102 mmol/L (ref 98–111)
Creatinine, Ser: 0.75 mg/dL (ref 0.44–1.00)
GFR calc Af Amer: >60 mL/min (ref >60)
GFR calc non Af Amer: >60 mL/min (ref >60)
GLUCOSE: 101 mg/dL — AB (ref 70–99)
POTASSIUM: 3.4 mmol/L — AB (ref 3.5–5.1)
Sodium: 141 mmol/L (ref 135–145)
TOTAL PROTEIN: 8 g/dL (ref 6.5–8.1)

## 2018-06-12 LAB — CBC WITH DIFFERENTIAL/PLATELET
BASOS ABS: 0 10*3/uL (ref 0.0–0.1)
Basophils Relative: 1 %
Eosinophils Absolute: 0.1 10*3/uL (ref 0.0–0.7)
Eosinophils Relative: 3 %
HEMATOCRIT: 42.1 % (ref 36.0–46.0)
Hemoglobin: 14 g/dL (ref 12.0–15.0)
Lymphocytes Relative: 42 %
Lymphs Abs: 1.8 10*3/uL (ref 0.7–4.0)
MCH: 28.1 pg (ref 26.0–34.0)
MCHC: 33.3 g/dL (ref 30.0–36.0)
MCV: 84.4 fL (ref 78.0–100.0)
MONO ABS: 0.3 10*3/uL (ref 0.1–1.0)
Monocytes Relative: 7 %
NEUTROS ABS: 2 10*3/uL (ref 1.7–7.7)
Neutrophils Relative %: 47 %
Platelets: 334 10*3/uL (ref 150–400)
RBC: 4.99 MIL/uL (ref 3.87–5.11)
RDW: 13.2 % (ref 11.5–15.5)
WBC: 4.2 10*3/uL (ref 4.0–10.5)

## 2018-06-12 LAB — URINALYSIS, ROUTINE W REFLEX MICROSCOPIC
BILIRUBIN URINE: NEGATIVE
Glucose, UA: NEGATIVE mg/dL
Hgb urine dipstick: NEGATIVE
KETONES UR: NEGATIVE mg/dL
Leukocytes, UA: NEGATIVE
Nitrite: NEGATIVE
PROTEIN: NEGATIVE mg/dL
SPECIFIC GRAVITY, URINE: 1.005 (ref 1.005–1.030)
pH: 7 (ref 5.0–8.0)

## 2018-06-12 LAB — LIPASE, BLOOD: LIPASE: 30 U/L (ref 11–51)

## 2018-06-12 MED ORDER — SODIUM CHLORIDE 0.9 % IV BOLUS
1000.0000 mL | Freq: Once | INTRAVENOUS | Status: AC
  Administered 2018-06-12: 1000 mL via INTRAVENOUS

## 2018-06-12 MED ORDER — FLEET ENEMA 7-19 GM/118ML RE ENEM
1.0000 | ENEMA | Freq: Once | RECTAL | Status: AC
  Administered 2018-06-12: 1 via RECTAL
  Filled 2018-06-12: qty 1

## 2018-06-12 MED ORDER — POTASSIUM CHLORIDE CRYS ER 20 MEQ PO TBCR
20.0000 meq | EXTENDED_RELEASE_TABLET | Freq: Once | ORAL | Status: AC
  Administered 2018-06-12: 20 meq via ORAL
  Filled 2018-06-12: qty 1

## 2018-06-12 NOTE — ED Notes (Addendum)
Dr. Rush Landmark at bedside to perform disimpaction.

## 2018-06-12 NOTE — ED Triage Notes (Signed)
Pt seen Sat at [facility] with same cmoplaints. Pt states she has had lower abdominal pain, radiating to her back, as well as constipation. Pt states it feels like there is a blockage. Pt states she was given an rx for miralax, which didn't help.

## 2018-06-12 NOTE — ED Notes (Signed)
Pt attempting to go to bathroom.

## 2018-06-12 NOTE — Discharge Instructions (Signed)
Your exam today was reassuring and we did not find any evidence of impaction that I could remove when it was attempted twice.  Given your recent reassuring CT scan, we did not feel that repeat CT imaging would be additive at this time.  Your laboratory testing showed mild hypokalemia (low potassium) which we treated.  Given your persistent constipation and discomfort, please follow-up with gastroenterology for further management.  Please continue the MiraLAX you were prescribed last visit.  Please stay hydrated.  If any symptoms change or worsen, please return to the nearest emergency department.

## 2018-06-12 NOTE — ED Provider Notes (Signed)
[facility]-EMERGENCY DEPT Provider Note   CSN: [id number] Arrival date & time: 06/12/18  0750     History   Chief Complaint Chief Complaint  Patient presents with  . Constipation    HPI Tracie Leach is a 42 y.o. female.  The history is provided by the patient and medical records. No language interpreter was used.  Constipation   This is a recurrent problem. The current episode started more than 1 week ago. The stool is described as firm. Associated symptoms include abdominal pain. Pertinent negatives include no flatus and no dysuria. She has tried osmotic agents for the symptoms. The treatment provided no relief. Her past medical history does not include neuromuscular disease.    Past Medical History:  Diagnosis Date  . Hypertension     There are no active problems to display for this patient.   Past Surgical History:  Procedure Laterality Date  . ABDOMINAL HYSTERECTOMY    . EYE SURGERY       OB History   None      Home Medications    Prior to Admission medications   Medication Sig Start Date End Date Taking? Authorizing Provider  chlorthalidone (HYGROTON) 25 MG tablet Take 25 mg by mouth daily.    [provider]  hydrochlorothiazide (HYDRODIURIL) 25 MG tablet Take 12.5 mg by mouth daily.    [provider]  ibuprofen (ADVIL) 200 MG tablet Take 400 mg by mouth every 6 (six) hours as needed for headache.    [provider]  lisinopril-hydrochlorothiazide (PRINZIDE,ZESTORETIC) 10-12.5 MG per tablet Take 1 tablet by mouth daily. Patient not taking: Reported on 06/09/2018 08/29/14   Marlon Pel, PA-C  Multiple Vitamin (MULTIVITAMIN WITH MINERALS) TABS tablet Take 1 tablet by mouth daily.    [provider]  Multiple Vitamins-Minerals (HAIR SKIN AND NAILS FORMULA PO) Take 2 tablets by mouth daily.    [provider]  polyethylene glycol (MIRALAX / GLYCOLAX) packet Take 17 g by mouth 2 (two) times  daily. 06/09/18   Muthersbaugh, Dahlia Client, PA-C  Vitamin D, Ergocalciferol, (DRISDOL) 50000 units CAPS capsule Take 50,000 Units by mouth every 7 (seven) days.    [provider]    Family History No family history on file.  Social History Social History   Tobacco Use  . Smoking status: Never Smoker  . Smokeless tobacco: Never Used  Substance Use Topics  . Alcohol use: Yes    Comment: occasional-1-2 every 2 weeks  . Drug use: No     Allergies   Patient has no known allergies.   Review of Systems Review of Systems  Constitutional: Negative for chills, diaphoresis, fatigue and fever.  HENT: Negative for congestion.   Respiratory: Negative for cough, chest tightness, shortness of breath and wheezing.   Cardiovascular: Negative for chest pain and palpitations.  Gastrointestinal: Positive for abdominal pain and constipation. Negative for anal bleeding, blood in stool, diarrhea, flatus, nausea and vomiting.  Genitourinary: Negative for dysuria and flank pain.  Musculoskeletal: Negative for back pain, neck pain and neck stiffness.  Skin: Negative for rash and wound.  Neurological: Negative for light-headedness and headaches.  All other systems reviewed and are negative.    Physical Exam Updated Vital Signs BP (!) 153/105   Pulse 75   Temp 98.7 F (37.1 C) (Oral)   Resp 15   Ht 5\' 4"  (1.626 m)   Wt 83.9 kg   SpO2 95%   BMI 31.75 kg/m   Physical Exam  Constitutional: She appears well-developed and well-nourished. No distress.  HENT:  Head: Normocephalic and atraumatic.  Mouth/Throat: Oropharynx is clear and moist. No oropharyngeal exudate.  Eyes: Pupils are equal, round, and reactive to light. Conjunctivae and EOM are normal.  Neck: Normal range of motion. Neck supple.  Cardiovascular: Normal rate and regular rhythm.  No murmur heard. Pulmonary/Chest: Effort normal and breath sounds normal. No respiratory distress. She has no wheezes. She exhibits no  tenderness.  Abdominal: Soft. She exhibits no distension. There is no tenderness. There is no guarding.  Musculoskeletal: She exhibits no edema or tenderness.  Neurological: She is alert. No sensory deficit. She exhibits normal muscle tone.  Skin: Skin is warm and dry. Capillary refill takes less than 2 seconds. She is not diaphoretic.  Psychiatric: She has a normal mood and affect.  Nursing note and vitals reviewed.    ED Treatments / Results  Labs (all labs ordered are listed, but only abnormal results are displayed) Labs Reviewed  COMPREHENSIVE METABOLIC PANEL - Abnormal; Notable for the following components:      Result Value   Potassium 3.4 (*)    Glucose, Bld 101 (*)    All other components within normal limits  URINALYSIS, ROUTINE W REFLEX MICROSCOPIC - Abnormal; Notable for the following components:   Color, Urine STRAW (*)    All other components within normal limits  URINE CULTURE  CBC WITH DIFFERENTIAL/PLATELET  LIPASE, BLOOD    EKG None  Radiology No results found.  Procedures Fecal disimpaction Date/Time: 06/12/2018 12:49 PM Performed by: Heide Scales, MD Authorized by: Heide Scales, MD  Unsuccessful attempt Consent: Verbal consent obtained. Risks and benefits: risks, benefits and alternatives were discussed Consent given by: patient Patient understanding: patient states understanding of the procedure being performed Patient identity confirmed: verbally with patient Preparation: Patient was prepped and draped in the usual sterile fashion. Local anesthesia used: no  Anesthesia: Local anesthesia used: no  Sedation: Patient sedated: no  Patient tolerance: Patient tolerated the procedure well with no immediate complications Comments: Unsuccessful disimpaction attempt.  Fecal disimpaction Date/Time: 06/12/2018 12:50 PM Performed by: Heide Scales, MD Authorized by: Heide Scales, MD  Unsuccessful attempt Consent:  Verbal consent obtained. Consent given by: patient Time out: Immediately prior to procedure a "time out" was called to verify the correct patient, procedure, equipment, support staff and site/side marked as required. Preparation: Patient was prepped and draped in the usual sterile fashion. Local anesthesia used: no  Anesthesia: Local anesthesia used: no  Sedation: Patient sedated: no  Patient tolerance: Patient tolerated the procedure well with no immediate complications Comments: Second attempt was also unsuccessful for disimpaction.    (including critical care time)  Medications Ordered in ED Medications  sodium chloride 0.9 % bolus 1,000 mL (0 mLs Intravenous Stopped 06/12/18 1119)  sodium phosphate (FLEET) 7-19 GM/118ML enema 1 enema (1 enema Rectal Given 06/12/18 0954)  potassium chloride SA (K-DUR,KLOR-CON) CR tablet 20 mEq (20 mEq Oral Given 06/12/18 1125)  sodium phosphate (FLEET) 7-19 GM/118ML enema 1 enema (1 enema Rectal Given 06/12/18 1131)     Initial Impression / Assessment and Plan / ED Course  I have reviewed the triage vital signs and the nursing notes.  Pertinent labs & imaging results that were available during my care of the patient were reviewed by me and considered in my medical decision making (see chart for details).     Tracie Leach is a 42 y.o. female with a past medical history significant  for hypertension, prior hysterectomy, and recent constipation who presents with decreased bowel movement and abdominal discomfort.  She reports that she is still constipated.  She reports that up until this last month she has always had one bowel movement in the morning and 1 in the afternoon.  She reports that she started having some change in her bowel movements and more constipation over the last few weeks.  She says that she was started on HCTZ shortly after the constipation started.  She is unsure if this is contributing.  She says that she was seen several days ago for  constipation and ended up having 2 enemas which caused some improvement in the bowel movement.  She says that she had a small bowel movement yesterday but has not had one today.  She reports abdominal cramping and discomfort similar to the prior constipation.  During her last visit she had a CT scan which showed no significant abnormalities including no evidence of obstruction diverticulitis or other concerns.  She denies any vaginal discharge, vaginal bleeding, or pelvic symptoms.  She denies any urinary symptoms, fevers, chills, chest pain or other symptoms.  On exam, lungs clear chest nontender.  Abdomen is nontender on my exam.  No CVA tenderness.  Clinical I suspect patient still constipated.  She reports she has taken MiraLAX over the last several days with she did not improve her symptoms.  She also tried a glycerin suppository without success.  Patient was offered a disimpaction which she wanted to attempt.  Manual disimpaction did not produce a large amount of stool.  It is hoped that the stool was loosened and an enema will be attempted.  Patient was given fluids and have work-up to look for electrode imbalance or UTI that may contribute to constipation.  Patient has bowel movement, will likely be stable for discharge home assuming labs are reassuring.  A second disimpaction was attempted and was still unsuccessful.  Patient had a second enema and began having bowel movements.  She reports she is still feels that "something is stuck in there" but she is now having bowel movements.    Patient encouraged to continue her MiraLAX and follow-up with gastroenterology.  Patient was given potassium for mild hypokalemia and will follow-up as directed.  Patient should return precautions and hydration instructions.  Patient discharged in good condition after having bowel movements.   Final Clinical Impressions(s) / ED Diagnoses   Final diagnoses:  Constipation, unspecified constipation type    Generalized abdominal pain    ED Discharge Orders    None      Clinical Impression: 1. Constipation, unspecified constipation type   2. Generalized abdominal pain     Disposition: Discharge  Condition: Good  I have discussed the results, Dx and Tx plan with the pt(& family if present). He/she/they expressed understanding and agree(s) with the plan. Discharge instructions discussed at great length. Strict return precautions discussed and pt &/or family have verbalized understanding of the instructions. No further questions at time of discharge.    New Prescriptions   No medications on file    Follow Up: [facility] [address] [place] [address] [phone] Follow up This can become your primary doctor without insurance.  Once you make an appointment here, please schedule a FINANCIAL COUNSELING appointment here and use this location for your [facility] needs.  [facility] [address] [place] [address] [phone]       Tegeler, Cristal Deer  J, MD 06/12/18 1643

## 2018-06-13 LAB — URINE CULTURE: Culture: <10000 — AB

## 2018-07-24 ENCOUNTER — Encounter

## 2018-07-24 ENCOUNTER — Other Ambulatory Visit: Payer: Self-pay

## 2018-07-24 ENCOUNTER — Ambulatory Visit (INDEPENDENT_AMBULATORY_CARE_PROVIDER_SITE_OTHER): Payer: Self-pay | Admitting: Gastroenterology

## 2018-07-24 ENCOUNTER — Encounter: Payer: Self-pay | Admitting: Gastroenterology

## 2018-07-24 VITALS — BP 130/100 | HR 88 | Ht 64.0 in | Wt 174.8 lb

## 2018-07-24 DIAGNOSIS — K5900 Constipation, unspecified: Secondary | ICD-10-CM

## 2018-07-24 DIAGNOSIS — R194 Change in bowel habit: Secondary | ICD-10-CM

## 2018-07-24 DIAGNOSIS — R140 Abdominal distension (gaseous): Secondary | ICD-10-CM

## 2018-07-24 MED ORDER — LINACLOTIDE 72 MCG PO CAPS: 72.0000 ug | ORAL_CAPSULE | Freq: Every day | ORAL | 0 refills | Status: DC

## 2018-07-24 NOTE — Patient Instructions (Signed)
Your provider has requested that you go to the basement level for lab work before leaving today. Press "B" on the elevator. The lab is located at the first door on the left as you exit the elevator.  We have given you samples of Linzess 72 mcg to take one tablet by mouth daily until your follow up appointment.   Start over the counter Fibercon supplement 1-2 pills daily and Docusate 1-2 pills daily.

## 2018-07-24 NOTE — Progress Notes (Signed)
GASTROENTEROLOGY OUTPATIENT CLINIC VISIT   Primary Care Provider Patient, No Pcp Per No address on file None  Referring Provider No referring provider defined for this encounter.   Patient Profile: Tracie Leach is a 42 y.o. female with a pmh significant for HTN.  The patient presents to the [facility] for an evaluation and management of problem(s) noted below:  Problem List 1. Constipation, unspecified constipation type   2. Change in bowel habits   3. Bloating     History of Present Illness: This is a patient presented to the [facility].  She presents for evaluation of problems noted above.  She describes a very hectic work schedule.  She works for [employer] is working more than 50 hours a week at this point in time.  She regards to the change in her bowel habits occurring over the course the last few weeks.  She describes having noted an increase weight gain over the course of the last few months as a result of her work as such she bought some Slim teas in an effort of trying to optimize her weight.  She drank these teas for about 2 weeks.  She was not sure exactly what was in them however.  At the time that she began drinking these teas she had an increase in her overall bowel frequency.  She is going anywhere from 4-5 times per day when normally she was a 2 time a day kind of person.  Stools were normally around a Bristol scale 3 or 4.  After taking the tea her stools became less formed and were around a level 5 or 6.  As a result of the increased frequency she ended stopping taking the teas.  Interestingly after stopping her teas she began to develop GI symptoms.  The symptoms included bloating as well as abdominal discomfort and abdominal distention.  She developed constipation.  Now she was no longer going on a daily basis.  She has had 2 visits to urgent care or the ED for potential disimpactions.  She is tried enemas on multiple occasions.  She is  using fleets suppositories at this point in time.  She has used MiraLAX 1 capful daily.  In the past she has used magnesium citrate but has not trialed that currently.  The patient has not been on any fiber supplementation.  She had a prior hysterectomy as a result of fibroids.  She is noted no blood in her stools.  She has been on an antibiotic within the last 6 months for another indication.  She has a history of lactose intolerance but has not noted any changes in her diet that would be concerning for this.  She has not kept a diary.  Her last bowel movement was the day prior to her visit in clinic.  She notes the stools are now very hard and small in nature around a Bristol scale 2.  The patient does not take nonsteroidals or BC/Goody powders.  She is never had an upper or lower endoscopy.  GI Review of Systems Positive as above Negative for pyrosis, dysphagia, odynophagia, jaundice, change in appetite, melena  Review of Systems General: Positive for intentional weight loss; denies fevers/chills HEENT: Denies oral lesions Cardiovascular: Denies chest pain Pulmonary: Denies shortness of breath Gastroenterological: See HPI Genitourinary: Denies darkened urine Hematological: Denies easy bruising/bleeding Endocrine: Denies temperature intolerance Dermatological: Denies skin changes Psychological: Mood is stable Musculoskeletal: Denies new arthralgias   Medications Current Outpatient Medications  Medication Sig Dispense Refill  . chlorthalidone (HYGROTON) 25 MG tablet Take 25 mg by mouth daily.    . hydrochlorothiazide (HYDRODIURIL) 25 MG tablet Take 12.5 mg by mouth daily.    . Multiple Vitamin (MULTIVITAMIN WITH MINERALS) TABS tablet Take 1 tablet by mouth daily.    . Vitamin D, Ergocalciferol, (DRISDOL) 50000 units CAPS capsule Take 50,000 Units by mouth every 7 (seven) days.    Marland Kitchen linaclotide (LINZESS) 72 MCG capsule Take 1 capsule (72 mcg total) by mouth daily before breakfast. 30  capsule 0   No current facility-administered medications for this visit.     Allergies No Known Allergies  Histories Past Medical History:  Diagnosis Date  . Hypertension    Past Surgical History:  Procedure Laterality Date  . ABDOMINAL HYSTERECTOMY    . EYE SURGERY     Social History   Socioeconomic History  . Marital status: Married    Spouse name: Not on file  . Number of children: 3  . Years of education: Not on file  . Highest education level: Not on file  Occupational History  . Not on file  Social Needs  . Financial resource strain: Not on file  . Food insecurity:    Worry: Not on file    Inability: Not on file  . Transportation needs:    Medical: Not on file    Non-medical: Not on file  Tobacco Use  . Smoking status: Never Smoker  . Smokeless tobacco: Never Used  Substance and Sexual Activity  . Alcohol use: Yes    Comment: occasional-1-2 every 2 weeks  . Drug use: No  . Sexual activity: Not on file  Lifestyle  . Physical activity:    Days per week: Not on file    Minutes per session: Not on file  . Stress: Not on file  Relationships  . Social connections:    Talks on phone: Not on file    Gets together: Not on file    Attends religious service: Not on file    Active member of club or organization: Not on file    Attends meetings of clubs or organizations: Not on file    Relationship status: Not on file  . Intimate partner violence:    Fear of current or ex partner: Not on file    Emotionally abused: Not on file    Physically abused: Not on file    Forced sexual activity: Not on file  Other Topics Concern  . Not on file  Social History Narrative  . Not on file   Family History  Problem Relation Age of Onset  . Bowel Disease Maternal Aunt   . Colon cancer Neg Hx   . Esophageal cancer Neg Hx   . Inflammatory bowel disease Neg Hx   . Liver disease Neg Hx   . Pancreatic cancer Neg Hx   . Rectal cancer Neg Hx   . Stomach cancer Neg Hx     I have reviewed her medical, social, and family history in detail and updated the electronic medical record as necessary.    PHYSICAL EXAMINATION  BP (!) 130/100   Pulse 88   Ht 5\' 4"  (1.626 m)   Wt 174 lb 12.8 oz (79.3 kg)   BMI 30.00 kg/m  Wt Readings from Last 3 Encounters:  07/24/18 174 lb 12.8 oz (79.3 kg)  06/12/18 185 lb (83.9 kg)  06/08/18 185 lb (83.9 kg)  GEN: NAD, appears stated age, doesn't appear chronically  ill PSYCH: Cooperative, without pressured speech EYE: Conjunctivae pink, sclerae anicteric ENT: MMM, without oral ulcers, no erythema or exudates noted NECK: Supple CV: RR without R/Gs  RESP: CTAB posteriorly, without wheezing GI: NABS, soft, rounded, NT/ND, without rebound or guarding, no HSM appreciated GU: DRE shows hemorrhoids, she has normal perineal descent as well as some brown stool in the vault that is hard on palpation, likely internal hemorrhoids on palpation as well MSK/EXT: No significant lower extremity edema SKIN: No jaundice NEURO:  Alert & Oriented x 3, no focal deficits   REVIEW OF DATA  I reviewed the following data at the time of this encounter:  GI Procedures and Studies  No relevant studies  Laboratory Studies  Reviewed in epic  Imaging Studies  9/19 CTAP IMPRESSION: No acute process within the abdomen or pelvis.   ASSESSMENT  Ms. Thursby is a 42 y.o. female  with a pmh significant for HTN.  The patient is seen today for evaluation and management of:  1. Constipation, unspecified constipation type   2. Change in bowel habits   3. Bloating    This is a hemodynamically stable patient who presents for evaluation of changes in bowel habits and new onset constipation over the course the last few months.  Patient has never experienced symptoms like this previously.  We will begin a metabolic work-up to rule out etiologies for possible changes in her bowel habits.  We will start her on fiber supplementation as well as transition  of her laxatives to something that may be a slightly stronger.  Will give her samples for Linzess at 72 mcg daily and see how she does over the course the next few weeks and potentially increase her dosing the samples if necessary.  The patient is paying for everything out of pocket at this point in time due to not having insurance.  We will see how her symptoms are mitigated based on the current use of the sample medication for laxatives.  She will keep a journal/diary of her symptoms as well.  If we are unable to find anything on her laboratory work-up and she continues to have issues even while taking Linzess we will need to consider a diagnostic colonoscopy.  The risks and benefits of endoscopic evaluation were discussed with the patient; these include but are not limited to the risk of perforation, infection, bleeding, missed lesions, lack of diagnosis, severe illness requiring hospitalization, as well as anesthesia and sedation related illnesses.  The patient is agreeable to proceed if it is deemed indicated.  All patient questions were answered, to the best of my ability, and the patient agrees to the aforementioned plan of action with follow-up as indicated.   PLAN  1. Constipation, unspecified constipation type - Calcium, ionized; Future - Begin Fibercon 1-2 pills daily - Begin Colace 1-3 pills daily - Linzess 72 mcg QD samples to be given to patient - If workup is unrevealing and symptoms persisting, then will need to consider role of diagnostic colonoscopy - If unsuccessful may increase dosing to 145 Linzess vs a GoLytely preparation in attempt to cleaning patient out and then proceeding with addition of Linzess or adequate Miralax dosing.  2. Change in bowel habits  3. Bloating -Consider possible SIBO (though would be rare as she is not having diarrheal symptoms)   Orders Placed This Encounter  Procedures  . Calcium, ionized    New Prescriptions   LINACLOTIDE (LINZESS) 72 MCG  CAPSULE    Take 1 capsule (72  mcg total) by mouth daily before breakfast.   Modified Medications   No medications on file    Planned Follow Up: No follow-ups on file.   Corliss Parish, MD [facility] Office # [phone]

## 2018-07-25 ENCOUNTER — Encounter: Payer: Self-pay | Admitting: Gastroenterology

## 2018-07-25 DIAGNOSIS — R140 Abdominal distension (gaseous): Secondary | ICD-10-CM | POA: Insufficient documentation

## 2018-07-25 DIAGNOSIS — R194 Change in bowel habit: Secondary | ICD-10-CM | POA: Insufficient documentation

## 2018-07-25 DIAGNOSIS — K5900 Constipation, unspecified: Secondary | ICD-10-CM | POA: Insufficient documentation

## 2018-07-25 LAB — CALCIUM, IONIZED: Calcium, Ion: 5.26 mg/dL (ref 4.8–5.6)

## 2018-08-22 ENCOUNTER — Ambulatory Visit: Payer: Self-pay | Admitting: Gastroenterology

## 2018-11-04 ENCOUNTER — Encounter (HOSPITAL_COMMUNITY): Payer: Self-pay

## 2018-11-04 ENCOUNTER — Emergency Department (HOSPITAL_COMMUNITY)
Admission: EM | Admit: 2018-11-04 | Discharge: 2018-11-04 | Disposition: A | Discharge status: Home / Self Care | Payer: Self-pay | Attending: Emergency Medicine | Admitting: Emergency Medicine

## 2018-11-04 ENCOUNTER — Other Ambulatory Visit: Payer: Self-pay

## 2018-11-04 DIAGNOSIS — I10 Essential (primary) hypertension: Secondary | ICD-10-CM | POA: Insufficient documentation

## 2018-11-04 DIAGNOSIS — M62830 Muscle spasm of back: Secondary | ICD-10-CM | POA: Insufficient documentation

## 2018-11-04 DIAGNOSIS — Z79899 Other long term (current) drug therapy: Secondary | ICD-10-CM | POA: Insufficient documentation

## 2018-11-04 MED ORDER — METHOCARBAMOL 500 MG PO TABS: 500.0000 mg | ORAL_TABLET | Freq: Two times a day (BID) | ORAL | 0 refills | Status: DC

## 2018-11-04 NOTE — Discharge Instructions (Addendum)
Warm compresses to sore muscles for 30 minutes at a time. Take Robaxin as prescribed. Follow-up with your primary care provider for recheck and possible physical therapy referral. Monitor your blood pressure at home, return to ER for headache, chest pain, visual disturbance or other concerns.

## 2018-11-04 NOTE — ED Triage Notes (Signed)
Pt states that since last week, pt thought she had a pinched nerve in her right shoulder. Pt also c/o neck pain and pain in both shoulders.
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
## 2019-01-25 ENCOUNTER — Emergency Department (HOSPITAL_COMMUNITY)
Admission: EM | Admit: 2019-01-25 | Discharge: 2019-01-25 | Disposition: A | Discharge status: Home / Self Care | Payer: Self-pay | Attending: Emergency Medicine | Admitting: Emergency Medicine

## 2019-01-25 ENCOUNTER — Other Ambulatory Visit: Payer: Self-pay

## 2019-01-25 ENCOUNTER — Encounter (HOSPITAL_COMMUNITY): Payer: Self-pay | Admitting: Emergency Medicine

## 2019-01-25 ENCOUNTER — Emergency Department (HOSPITAL_COMMUNITY): Payer: Self-pay

## 2019-01-25 DIAGNOSIS — R51 Headache: Secondary | ICD-10-CM | POA: Insufficient documentation

## 2019-01-25 DIAGNOSIS — N760 Acute vaginitis: Secondary | ICD-10-CM | POA: Insufficient documentation

## 2019-01-25 DIAGNOSIS — Z79899 Other long term (current) drug therapy: Secondary | ICD-10-CM | POA: Insufficient documentation

## 2019-01-25 DIAGNOSIS — I10 Essential (primary) hypertension: Secondary | ICD-10-CM | POA: Insufficient documentation

## 2019-01-25 DIAGNOSIS — H538 Other visual disturbances: Secondary | ICD-10-CM | POA: Insufficient documentation

## 2019-01-25 DIAGNOSIS — R519 Headache, unspecified: Secondary | ICD-10-CM

## 2019-01-25 DIAGNOSIS — R109 Unspecified abdominal pain: Secondary | ICD-10-CM | POA: Insufficient documentation

## 2019-01-25 DIAGNOSIS — B9689 Other specified bacterial agents as the cause of diseases classified elsewhere: Secondary | ICD-10-CM

## 2019-01-25 LAB — WET PREP, GENITAL
Sperm: NONE SEEN
Trich, Wet Prep: NONE SEEN
Yeast Wet Prep HPF POC: NONE SEEN

## 2019-01-25 LAB — COMPREHENSIVE METABOLIC PANEL
ALT: 12 U/L (ref 0–44)
AST: 16 U/L (ref 15–41)
Albumin: 4.5 g/dL (ref 3.5–5.0)
Alkaline Phosphatase: 80 U/L (ref 38–126)
Anion gap: 9 (ref 5–15)
BUN: 10 mg/dL (ref 6–20)
CO2: 30 mmol/L (ref 22–32)
Calcium: 9.8 mg/dL (ref 8.9–10.3)
Chloride: 99 mmol/L (ref 98–111)
Creatinine, Ser: 0.97 mg/dL (ref 0.44–1.00)
GFR calc Af Amer: >60 mL/min (ref >60)
GFR calc non Af Amer: >60 mL/min (ref >60)
Glucose, Bld: 91 mg/dL (ref 70–99)
Potassium: 3.6 mmol/L (ref 3.5–5.1)
Sodium: 138 mmol/L (ref 135–145)
Total Bilirubin: 0.6 mg/dL (ref 0.3–1.2)
Total Protein: 8.4 g/dL — ABNORMAL HIGH (ref 6.5–8.1)

## 2019-01-25 LAB — URINALYSIS, ROUTINE W REFLEX MICROSCOPIC
Bilirubin Urine: NEGATIVE
Glucose, UA: NEGATIVE mg/dL
Hgb urine dipstick: NEGATIVE
Ketones, ur: NEGATIVE mg/dL
Leukocytes,Ua: NEGATIVE
Nitrite: NEGATIVE
Protein, ur: NEGATIVE mg/dL
Specific Gravity, Urine: 1.018 (ref 1.005–1.030)
pH: 5 (ref 5.0–8.0)

## 2019-01-25 LAB — CBC
HCT: 44.9 % (ref 36.0–46.0)
Hemoglobin: 15.3 g/dL — ABNORMAL HIGH (ref 12.0–15.0)
MCH: 29.4 pg (ref 26.0–34.0)
MCHC: 34.1 g/dL (ref 30.0–36.0)
MCV: 86.3 fL (ref 80.0–100.0)
Platelets: 306 10*3/uL (ref 150–400)
RBC: 5.2 MIL/uL — ABNORMAL HIGH (ref 3.87–5.11)
RDW: 13.2 % (ref 11.5–15.5)
WBC: 4.9 10*3/uL (ref 4.0–10.5)
nRBC: 0 % (ref 0.0–0.2)

## 2019-01-25 LAB — LIPASE, BLOOD: Lipase: 28 U/L (ref 11–51)

## 2019-01-25 MED ORDER — LIDOCAINE 5 % EX PTCH: 1.0000 | MEDICATED_PATCH | CUTANEOUS | 0 refills | Status: DC

## 2019-01-25 MED ORDER — SODIUM CHLORIDE 0.9% FLUSH
3.0000 mL | Freq: Once | INTRAVENOUS | Status: AC
  Administered 2019-01-25: 3 mL via INTRAVENOUS

## 2019-01-25 MED ORDER — KETOROLAC TROMETHAMINE 30 MG/ML IJ SOLN
30.0000 mg | Freq: Once | INTRAMUSCULAR | Status: AC
  Administered 2019-01-25: 30 mg via INTRAVENOUS
  Filled 2019-01-25: qty 1

## 2019-01-25 MED ORDER — HYDROCHLOROTHIAZIDE 12.5 MG PO CAPS: 12.5000 mg | ORAL_CAPSULE | Freq: Once | ORAL | Status: DC

## 2019-01-25 MED ORDER — CHLORTHALIDONE 25 MG PO TABS
25.0000 mg | ORAL_TABLET | Freq: Every day | ORAL | Status: DC
  Administered 2019-01-25: 25 mg via ORAL
  Filled 2019-01-25: qty 1

## 2019-01-25 MED ORDER — NAPROXEN 500 MG PO TABS: 500.0000 mg | ORAL_TABLET | Freq: Two times a day (BID) | ORAL | 0 refills | Status: AC

## 2019-01-25 MED ORDER — METRONIDAZOLE 500 MG PO TABS: 500.0000 mg | ORAL_TABLET | Freq: Two times a day (BID) | ORAL | 0 refills | Status: AC

## 2019-01-25 NOTE — Discharge Instructions (Addendum)
You may alternate taking Tylenol and Naproxen as needed for pain control. You may take Naproxen twice daily as directed on your discharge paperwork and you may take  500-1000 mg of Tylenol every 6 hours. Do not exceed 4000 mg of Tylenol daily as this can lead to liver damage. Also, make sure to take Naproxen with meals as it can cause an upset stomach. Do not take other NSAIDs while taking Naproxen such as (Aleve, Ibuprofen, Aspirin, Celebrex, etc) and do not take more than the prescribed dose as this can lead to ulcers and bleeding in your GI tract. You may use warm and cold compresses to help with your symptoms.   Use the lidoderm patches as directed.   Please follow up with your primary doctor within the next 7-10 days for re-evaluation and further treatment of your symptoms.   Please return to the ER sooner if you have any new or worsening symptoms.  

## 2019-01-25 NOTE — ED Notes (Signed)
Patient transported to CT 

## 2019-01-25 NOTE — ED Triage Notes (Signed)
Patient here from home with complaints of headache, blurred vision, and lower left side abd pain that started on yesterday. Also reports vaginal bleeding, hesterectomy 2011. Ambulatory.

## 2019-01-25 NOTE — ED Notes (Signed)
ED Provider at bedside. 

## 2019-01-25 NOTE — ED Notes (Signed)
Pt states she did not take her blood pressure medicine today, last took yesterday.

## 2019-01-25 NOTE — ED Provider Notes (Signed)
[facility]-EMERGENCY DEPT Provider Note   CSN: [id number] Arrival date & time: 01/25/19  1053    History   Chief Complaint Chief Complaint  Patient presents with  . Abdominal Pain  . Blurred Vision  . Headache  . Vaginal Bleeding    HPI Tracie Leach is a 43 y.o. female.     HPI   Pt is a 43 y/o female wit ha h/o HTN who presents to the ED today with multiple complaints.  Abd pain and vaginal bleeding/hematuria?: States she has had LLQ abd pain for the last 2-3 weeks. Pain also located to the left flank. States pain is worse now. Pain rated 8/10. Pain is constant. Pain feels sharp. Has tried taking tylenol intermittently without significant relief. States that pain feels somewhat improved with massage. Pain worse when ambulating. States that when she wiped today she noticed some blood on the tissue. She is unsure if this was from her vagina or of she had hematuria. Reports nausea, but denies vomiting. No diarrhea, constipation, dysuria. Reports urinary frequency x1 week. No fevers. No vaginal discharge.  Headache: Reports headaches for the last 2 weeks since she ran out of her blood pressure medication. Headache located to bilat temporal region. Pain rated 7-8/10. Headaches have been constant.  States she has bilat blurred vision. States that when she opens her eyes in the morning she feels like her eyes are cloudy and she has to blink a few times before her vision in normal and she can focus. She also reports photophobia. She tried eye drops and they improved her sxs. No numbness/weakness. No facial droop or slurred speech. No aphasia. No ataxia.  States she was out of BP meds for the last 3 weeks. She was able to get her rx filled yesterday via telemedicine visit. Has not been able to see PCP [place]  Past Medical History:  Diagnosis Date  . Hypertension     Patient Active Problem List   Diagnosis Date Noted  . Constipation  07/25/2018  . Change in bowel habits 07/25/2018  . Bloating 07/25/2018    Past Surgical History:  Procedure Laterality Date  . ABDOMINAL HYSTERECTOMY    . EYE SURGERY       OB History   No obstetric history on file.      Home Medications    Prior to Admission medications   Medication Sig Start Date End Date Taking? Authorizing Provider  chlorthalidone (HYGROTON) 25 MG tablet Take 25 mg by mouth daily.   Yes [provider]  hydrochlorothiazide (HYDRODIURIL) 12.5 MG tablet Take 12.5 mg by mouth daily.  01/23/19  Yes [provider]  Multiple Vitamin (MULTIVITAMIN WITH MINERALS) TABS tablet Take 1 tablet by mouth daily.   Yes [provider]  Vitamin D, Ergocalciferol, (DRISDOL) 50000 units CAPS capsule Take 50,000 Units by mouth every 7 (seven) days.   Yes [provider]  lidocaine (LIDODERM) 5 % Place 1 patch onto the skin daily. Remove & Discard patch within 12 hours or as directed by MD 01/25/19   Rod Majerus S, PA-C  linaclotide (LINZESS) 72 MCG capsule Take 1 capsule (72 mcg total) by mouth daily before breakfast. Patient not taking: Reported on 01/25/2019 07/24/18   Mansouraty, Netty StarringGabriel Jr., MD  methocarbamol (ROBAXIN) 500 MG tablet Take 1 tablet (500 mg total) by mouth 2 (two) times daily. Patient not taking: Reported on 01/25/2019 11/04/18   Army MeliaMurphy, Laura A, PA-C  metroNIDAZOLE (  FLAGYL) 500 MG tablet Take 1 tablet (500 mg total) by mouth 2 (two) times daily for 7 days. 01/25/19 02/01/19  Vada Yellen S, PA-C  naproxen (NAPROSYN) 500 MG tablet Take 1 tablet (500 mg total) by mouth 2 (two) times daily for 5 days. 01/25/19 01/30/19  Parrie Rasco S, PA-C    Family History Family History  Problem Relation Age of Onset  . Bowel Disease Maternal Aunt   . Colon cancer Neg Hx   . Esophageal cancer Neg Hx   . Inflammatory bowel disease Neg Hx   . Liver disease Neg Hx   . Pancreatic cancer Neg Hx   . Rectal cancer Neg Hx   . Stomach cancer  Neg Hx     Social History Social History   Tobacco Use  . Smoking status: Never Smoker  . Smokeless tobacco: Never Used  Substance Use Topics  . Alcohol use: Yes    Comment: occasional-1-2 every 2 weeks  . Drug use: No     Allergies   Patient has no known allergies.   Review of Systems Review of Systems  Constitutional: Negative for chills and fever.  HENT: Negative for ear pain and sore throat.   Eyes: Positive for photophobia and visual disturbance.  Respiratory: Negative for cough and shortness of breath.   Cardiovascular: Negative for chest pain.  Gastrointestinal: Positive for abdominal pain and nausea. Negative for constipation, diarrhea and vomiting.  Genitourinary: Positive for frequency, hematuria (?) and vaginal bleeding (?). Negative for dysuria, pelvic pain, urgency and vaginal discharge.  Musculoskeletal: Negative for back pain.  Skin: Negative for rash.  Neurological: Positive for headaches. Negative for dizziness, speech difficulty, weakness and numbness.  All other systems reviewed and are negative.    Physical Exam Updated Vital Signs BP (!) 158/107   Pulse 74   Temp 98.8 F (37.1 C) (Oral)   Resp 18   SpO2 98%   Physical Exam Vitals signs and nursing note reviewed.  Constitutional:      General: She is not in acute distress.    Appearance: She is well-developed. She is not ill-appearing or toxic-appearing.  HENT:     Head: Normocephalic and atraumatic.  Eyes:     Extraocular Movements: Extraocular movements intact.     Conjunctiva/sclera: Conjunctivae normal.     Pupils: Pupils are equal, round, and reactive to light.     Comments: no nystagmus  Neck:     Musculoskeletal: Neck supple.  Cardiovascular:     Rate and Rhythm: Normal rate and regular rhythm.     Heart sounds: No murmur.  Pulmonary:     Effort: Pulmonary effort is normal. No respiratory distress.     Breath sounds: Normal breath sounds. No stridor. No wheezing, rhonchi or  rales.  Abdominal:     General: Bowel sounds are normal.     Palpations: Abdomen is soft.     Tenderness: There is abdominal tenderness in the left lower quadrant. There is left CVA tenderness. There is no right CVA tenderness, guarding or rebound.  Genitourinary:    Comments: Exam performed by Karrie Meres,  exam chaperoned Date: 01/25/2019 Pelvic exam: normal external genitalia without evidence of trauma. VULVA: normal appearing vulva with no masses, tenderness or lesion. VAGINA: normal appearing vagina with normal color and discharge, no lesions. No blood in the vaginal vault.  CERVIX: surgically absent.  Wet prep and DNA probe for chlamydia and GC obtained.  ADNEXA: normal adnexa in size, nontender and no masses  UTERUS: surgically absent, no ttp Musculoskeletal:     Comments: TTP To the left mid thoracic paraspinous muscles  Skin:    General: Skin is warm and dry.  Neurological:     Mental Status: She is alert.     Comments: Mental Status:  Alert, thought content appropriate, able to give a coherent history. Speech fluent without evidence of aphasia. Able to follow 2 step commands without difficulty.  Cranial Nerves:  II: pupils equal, round, reactive to light III,IV, VI: ptosis not present, extra-ocular motions intact bilaterally  V,VII: smile symmetric, facial light touch sensation equal VIII: hearing grossly normal to voice  X: uvula elevates symmetrically  XI: bilateral shoulder shrug symmetric and strong XII: midline tongue extension without fassiculations Motor:  Normal tone. 5/5 strength of BUE and BLE major muscle groups including strong and equal grip strength and dorsiflexion/plantar flexion Sensory: light touch normal in all extremities. Cerebellar: normal finger-to-nose with bilateral upper extremities Gait: normal gait and balance.   Psychiatric:     Comments: anxious     ED Treatments / Results  Labs (all labs ordered are listed, but only abnormal  results are displayed) Labs Reviewed  WET PREP, GENITAL - Abnormal; Notable for the following components:      Result Value   Clue Cells Wet Prep HPF POC PRESENT (*)    WBC, Wet Prep HPF POC RARE (*)    All other components within normal limits  COMPREHENSIVE METABOLIC PANEL - Abnormal; Notable for the following components:   Total Protein 8.4 (*)    All other components within normal limits  CBC - Abnormal; Notable for the following components:   RBC 5.20 (*)    Hemoglobin 15.3 (*)    All other components within normal limits  LIPASE, BLOOD  URINALYSIS, ROUTINE W REFLEX MICROSCOPIC  GC/CHLAMYDIA PROBE AMP ([place]) NOT AT [facility]    EKG None  Radiology Ct Head Wo Contrast  Result Date: 01/25/2019 CLINICAL DATA:  43 year old female with acute headache and blurred vision for 2 weeks. EXAM: CT HEAD WITHOUT CONTRAST TECHNIQUE: Contiguous axial images were obtained from the base of the skull through the vertex without intravenous contrast. COMPARISON:  04/14/2016 and prior CTs FINDINGS: Brain: No evidence of acute infarction, hemorrhage, hydrocephalus, extra-axial collection or mass lesion/mass effect. Vascular: No hyperdense vessel or unexpected calcification. Skull: Normal. Negative for fracture or focal lesion. Sinuses/Orbits: No acute finding. Other: None. IMPRESSION: Unremarkable noncontrast head CT. Electronically Signed   By: Harmon Pier M.D.   On: 01/25/2019 13:19    Procedures Procedures (including critical care time)  Medications Ordered in ED Medications  chlorthalidone (HYGROTON) tablet 25 mg (25 mg Oral Given 01/25/19 1241)  sodium chloride flush (NS) 0.9 % injection 3 mL (3 mLs Intravenous Given 01/25/19 1148)  ketorolac (TORADOL) 30 MG/ML injection 30 mg (30 mg Intravenous Given 01/25/19 1356)     Initial Impression / Assessment and Plan / ED Course  I have reviewed the triage vital signs and the nursing notes.  Pertinent labs & imaging results that were available  during my care of the patient were reviewed by me and considered in my medical decision making (see chart for details).     Final Clinical Impressions(s) / ED Diagnoses   Final diagnoses:  Abdominal pain, unspecified abdominal location  Bacterial vaginitis  Nonintractable headache, unspecified chronicity pattern, unspecified headache type  Hypertension, unspecified type   Patient here with multiple complaints including left lower quadrant abdominal pain radiating to the left  flank, possible vaginal bleeding versus hematuria?,  And headache and blurred vision.  Pt hypertensive on arrival, BP meds given and BP improved somewhat. Otherwise VS are WNL.   Abd pain and vaginal bleeding/hematuria?: Patient with left lower quadrant of abdomen pain that radiates to the left flank.  Associated with possible vaginal bleeding versus hematuria.  Patient is unsure.  No nausea vomiting diarrhea or dysuria. Reviewed CT scan from prior visit 06/08/2018 which did not show any evidence of kidney stones or ureteral stones.  Kidney stone seems unlikely with this prior normal CT scan. Her UA is normal and without evidence of UTI or bleeding. She also has TTP to the left thoracic paraspinous muscles and along the left buttock. Her lab work is normal. Her sxs are improved after toradol and on repeat exam her abdomen is benign. I do not feel that she needs imaging today. I question MSK cause of sxs of her flank pain. She will need to f/u with her pcp in regards to further w/u for this.   Headache/blurred vision: Patient with headache and reports of blurred vision in setting of hypertension.   She also describes this as intermittent blurred vision that goes away with blinking.  Does not seem persistent.  CT head ordered to rule out large mass or hemorrhage which do not show any evidence of this.  She denies any blurred vision on re-eval.  Her headache resolved after a dose of Toradol.  Her neurologic exam is completely  benign. Sxs are of unclear etiology and I doubt acute cause at this time that would require further work-up or admission to the hospital.  Workup reassuring. Wet prep suggests BV, will tx with flagyl. Will give naproxen and lidoderm patches for her pain. Advised close monitoring and return if worse. All questions answered. Pt stable for discharge.  ED Discharge Orders         Ordered    naproxen (NAPROSYN) 500 MG tablet  2 times daily     01/25/19 1457    lidocaine (LIDODERM) 5 %  Every 24 hours     01/25/19 1457    metroNIDAZOLE (FLAGYL) 500 MG tablet  2 times daily     01/25/19 1457           Eiko Mcgowen S, PA-C 01/25/19 1638    Shaune Pollack, MD 01/26/19 [phone]

## 2019-01-27 LAB — GC/CHLAMYDIA PROBE AMP (~~LOC~~) NOT AT ARMC
Chlamydia: NEGATIVE
Neisseria Gonorrhea: NEGATIVE

## 2019-06-30 ENCOUNTER — Encounter (HOSPITAL_COMMUNITY): Payer: Self-pay

## 2019-06-30 ENCOUNTER — Emergency Department (HOSPITAL_COMMUNITY): Payer: Self-pay

## 2019-06-30 ENCOUNTER — Other Ambulatory Visit: Payer: Self-pay

## 2019-06-30 ENCOUNTER — Emergency Department (HOSPITAL_COMMUNITY)
Admission: EM | Admit: 2019-06-30 | Discharge: 2019-06-30 | Disposition: A | Discharge status: Home / Self Care | Payer: Self-pay | Attending: Emergency Medicine | Admitting: Emergency Medicine

## 2019-06-30 DIAGNOSIS — Z79899 Other long term (current) drug therapy: Secondary | ICD-10-CM | POA: Insufficient documentation

## 2019-06-30 DIAGNOSIS — R109 Unspecified abdominal pain: Secondary | ICD-10-CM | POA: Insufficient documentation

## 2019-06-30 DIAGNOSIS — M25552 Pain in left hip: Secondary | ICD-10-CM | POA: Insufficient documentation

## 2019-06-30 DIAGNOSIS — M79675 Pain in left toe(s): Secondary | ICD-10-CM | POA: Insufficient documentation

## 2019-06-30 DIAGNOSIS — W19XXXA Unspecified fall, initial encounter: Secondary | ICD-10-CM

## 2019-06-30 DIAGNOSIS — I10 Essential (primary) hypertension: Secondary | ICD-10-CM | POA: Insufficient documentation

## 2019-06-30 LAB — CBC WITH DIFFERENTIAL/PLATELET
Abs Immature Granulocytes: 0.04 10*3/uL (ref 0.00–0.07)
Basophils Absolute: 0.1 10*3/uL (ref 0.0–0.1)
Basophils Relative: 1 %
Eosinophils Absolute: 0.1 10*3/uL (ref 0.0–0.5)
Eosinophils Relative: 2 %
HCT: 44.7 % (ref 36.0–46.0)
Hemoglobin: 14.1 g/dL (ref 12.0–15.0)
Immature Granulocytes: 1 %
Lymphocytes Relative: 39 %
Lymphs Abs: 2.4 10*3/uL (ref 0.7–4.0)
MCH: 27.8 pg (ref 26.0–34.0)
MCHC: 31.5 g/dL (ref 30.0–36.0)
MCV: 88 fL (ref 80.0–100.0)
Monocytes Absolute: 0.3 10*3/uL (ref 0.1–1.0)
Monocytes Relative: 5 %
Neutro Abs: 3.2 10*3/uL (ref 1.7–7.7)
Neutrophils Relative %: 52 %
Platelets: 311 10*3/uL (ref 150–400)
RBC: 5.08 MIL/uL (ref 3.87–5.11)
RDW: 13.1 % (ref 11.5–15.5)
WBC: 6.1 10*3/uL (ref 4.0–10.5)
nRBC: 0 % (ref 0.0–0.2)

## 2019-06-30 LAB — COMPREHENSIVE METABOLIC PANEL
ALT: 13 U/L (ref 0–44)
AST: 16 U/L (ref 15–41)
Albumin: 4.3 g/dL (ref 3.5–5.0)
Alkaline Phosphatase: 71 U/L (ref 38–126)
Anion gap: 10 (ref 5–15)
BUN: 13 mg/dL (ref 6–20)
CO2: 25 mmol/L (ref 22–32)
Calcium: 9.3 mg/dL (ref 8.9–10.3)
Chloride: 101 mmol/L (ref 98–111)
Creatinine, Ser: 0.84 mg/dL (ref 0.44–1.00)
GFR calc Af Amer: >60 mL/min (ref >60)
GFR calc non Af Amer: >60 mL/min (ref >60)
Glucose, Bld: 107 mg/dL — ABNORMAL HIGH (ref 70–99)
Potassium: 3.9 mmol/L (ref 3.5–5.1)
Sodium: 136 mmol/L (ref 135–145)
Total Bilirubin: 0.4 mg/dL (ref 0.3–1.2)
Total Protein: 8.2 g/dL — ABNORMAL HIGH (ref 6.5–8.1)

## 2019-06-30 LAB — URINALYSIS, ROUTINE W REFLEX MICROSCOPIC
Bilirubin Urine: NEGATIVE
Glucose, UA: NEGATIVE mg/dL
Hgb urine dipstick: NEGATIVE
Ketones, ur: NEGATIVE mg/dL
Leukocytes,Ua: NEGATIVE
Nitrite: NEGATIVE
Protein, ur: NEGATIVE mg/dL
Specific Gravity, Urine: 1.015 (ref 1.005–1.030)
pH: 5 (ref 5.0–8.0)

## 2019-06-30 LAB — LIPASE, BLOOD: Lipase: 27 U/L (ref 11–51)

## 2019-06-30 MED ORDER — PREDNISONE 20 MG PO TABS
60.0000 mg | ORAL_TABLET | ORAL | Status: AC
  Administered 2019-06-30: 60 mg via ORAL
  Filled 2019-06-30: qty 3

## 2019-06-30 MED ORDER — PREDNISONE 20 MG PO TABS: 40.0000 mg | ORAL_TABLET | Freq: Every day | ORAL | 0 refills | Status: DC

## 2019-06-30 NOTE — Discharge Instructions (Addendum)
As discussed, your evaluation today has been largely reassuring.  But, it is important that you monitor your condition carefully, and do not hesitate to return to the ED if you develop new, or concerning changes in your condition. ? ?Otherwise, please follow-up with your physician for appropriate ongoing care. ? ?

## 2019-06-30 NOTE — ED Notes (Signed)
EDP at bedside  

## 2019-06-30 NOTE — ED Provider Notes (Signed)
[facility]-EMERGENCY DEPT Provider Note   CSN: [id number] Arrival date & time: 06/30/19  1443     History   Chief Complaint Chief Complaint  Patient presents with  . Flank Pain  . Toe Pain    HPI Tracie Leach is a 43 y.o. female.     HPI Patient presents with concern of ongoing left side pain, left toe pain. Notably, the patient was here 1 month ago, for similar concerns. She notes that she completed antibiotics and anti-inflammatories provided after that visit, but continues have pain focally about the left lateral abdomen, inferiorly.  Pain is sore, moderate, nonradiating, worse with activity. Left great toe pain is new, since that visit. She notes a history of prior trauma, states that without new precipitant she has recently developed severe pain throughout the left toe, in spite of OTC medication. No loss of sensation or weakness. She has no vomiting, no diarrhea, no fever, no chills.  Patient notes that her primary care physician has resigned since her most recent ED visit, and she has not had outpatient follow-up with anyone yet.  Past Medical History:  Diagnosis Date  . Hypertension     Patient Active Problem List   Diagnosis Date Noted  . Constipation 07/25/2018  . Change in bowel habits 07/25/2018  . Bloating 07/25/2018    Past Surgical History:  Procedure Laterality Date  . ABDOMINAL HYSTERECTOMY    . EYE SURGERY       OB History   No obstetric history on file.      Home Medications    Prior to Admission medications   Medication Sig Start Date End Date Taking? Authorizing Provider  hydrochlorothiazide (HYDRODIURIL) 12.5 MG tablet Take 12.5 mg by mouth daily.  01/23/19  Yes [provider]  ibuprofen (ADVIL) 200 MG tablet Take 200 mg by mouth every 6 (six) hours as needed for fever, headache or moderate pain.   Yes [provider]  Menthol-Camphor (ICY HOT ADVANCED RELIEF) 16-11 % CREA Apply 1 application  topically as needed (hand pain).   Yes [provider]  Menthol-Methyl Salicylate (MUSCLE RUB) 10-15 % CREA Apply 1 application topically as needed for muscle pain (hand pain).   Yes [provider]  lidocaine (LIDODERM) 5 % Place 1 patch onto the skin daily. Remove & Discard patch within 12 hours or as directed by MD Patient not taking: Reported on 06/30/2019 01/25/19   Couture, Cortni S, PA-C  linaclotide (LINZESS) 72 MCG capsule Take 1 capsule (72 mcg total) by mouth daily before breakfast. Patient not taking: Reported on 01/25/2019 07/24/18   Mansouraty, Netty Starring., MD  methocarbamol (ROBAXIN) 500 MG tablet Take 1 tablet (500 mg total) by mouth 2 (two) times daily. Patient not taking: Reported on 01/25/2019 11/04/18   Jeannie Fend, PA-C    Family History Family History  Problem Relation Age of Onset  . Bowel Disease Maternal Aunt   . Colon cancer Neg Hx   . Esophageal cancer Neg Hx   . Inflammatory bowel disease Neg Hx   . Liver disease Neg Hx   . Pancreatic cancer Neg Hx   . Rectal cancer Neg Hx   . Stomach cancer Neg Hx     Social History Social History   Tobacco Use  . Smoking status: Never Smoker  . Smokeless tobacco: Never Used  Substance Use Topics  . Alcohol use: Yes    Comment: occasional-1-2 every 2 weeks  . Drug use: No  Allergies   Patient has no known allergies.   Review of Systems Review of Systems  Constitutional:       Per HPI, otherwise negative  HENT:       Per HPI, otherwise negative  Respiratory:       Per HPI, otherwise negative  Cardiovascular:       Per HPI, otherwise negative  Gastrointestinal: Negative for vomiting.  Endocrine:       Negative aside from HPI  Genitourinary:       Neg aside from HPI   Musculoskeletal:       Per HPI, otherwise negative  Skin: Negative.   Allergic/Immunologic: Negative for immunocompromised state.  Neurological: Negative for syncope.     Physical Exam Updated Vital Signs BP  (!) 168/117   Pulse 64   Temp 98.3 F (36.8 C) (Oral)   Resp 18   Ht 5\' 4"  (1.626 m)   Wt 83.9 kg   SpO2 100%   BMI 31.76 kg/m   Physical Exam Vitals signs and nursing note reviewed.  Constitutional:      General: She is not in acute distress.    Appearance: She is well-developed.  HENT:     Head: Normocephalic and atraumatic.  Eyes:     Conjunctiva/sclera: Conjunctivae normal.  Cardiovascular:     Rate and Rhythm: Normal rate and regular rhythm.  Pulmonary:     Effort: Pulmonary effort is normal. No respiratory distress.     Breath sounds: Normal breath sounds. No stridor.  Abdominal:     General: There is no distension.     Tenderness: There is no abdominal tenderness. There is no guarding.     Comments: No abdominal guarding, tenderness, rebound  Musculoskeletal:       Legs:       Feet:  Skin:    General: Skin is warm and dry.  Neurological:     Mental Status: She is alert and oriented to person, place, and time.     Cranial Nerves: No cranial nerve deficit.      ED Treatments / Results  Labs (all labs ordered are listed, but only abnormal results are displayed) Labs Reviewed  COMPREHENSIVE METABOLIC PANEL - Abnormal; Notable for the following components:      Result Value   Glucose, Bld 107 (*)    Total Protein 8.2 (*)    All other components within normal limits  URINALYSIS, ROUTINE W REFLEX MICROSCOPIC - Abnormal; Notable for the following components:   Color, Urine STRAW (*)    All other components within normal limits  CBC WITH DIFFERENTIAL/PLATELET  LIPASE, BLOOD  I-STAT BETA HCG BLOOD, ED ([facility], [facility], [facility] ONLY)    EKG None  Radiology Dg Toe Great Left  Result Date: 06/30/2019 CLINICAL DATA:  Left great toe pain for 6 years. EXAM: LEFT GREAT TOE COMPARISON:  None. FINDINGS: There is no evidence of fracture or dislocation. There is no evidence of arthropathy or other focal bone abnormality. Soft tissues are unremarkable. IMPRESSION: Negative.  Electronically Signed   By: Sebastian AcheAllen  Grady M.D.   On: 06/30/2019 12:23   Dg Hip Unilat With Pelvis 2-3 Views Left  Result Date: 06/30/2019 CLINICAL DATA:  Chronic left hip pain after injury several years ago. EXAM: DG HIP (WITH OR WITHOUT PELVIS) 2-3V LEFT COMPARISON:  None. FINDINGS: There is no evidence of hip fracture or dislocation. There is no evidence of arthropathy or other focal bone abnormality. IMPRESSION: Negative. Electronically Signed   By: Fayrene FearingJames  Murlean Caller M.D.   On: 06/30/2019 12:24    Procedures Procedures (including critical care time)  Medications Ordered in ED Medications - No data to display   Initial Impression / Assessment and Plan / ED Course  I have reviewed the triage vital signs and the nursing notes.  Pertinent labs & imaging results that were available during my care of the patient were reviewed by me and considered in my medical decision making (see chart for details).    After the initial evaluation reviewed the patient's chart including notes from his recent visit, CT from last year, reassuring, though with evidence for bacterial vaginosis 1 month ago.    1:18 PM On repeat them patient is awake, alert, in no distress.  When she is moderately hypertensive.  She notes that she has recently had issues with her blood pressure medication, but has a new prescription waiting for her at her primary care office. He had a lengthy conversation about today's findings, reviewed results from the last visit as well. Labs reassuring, x-rays reassuring, some suspicion for musculoskeletal etiology. She now notes that after a fall a few years ago she has had pain in the left lower extremity, which improved after some time with chiropractic care, but has had recurrence. No new complaints, no evidence for hemodynamic stability, no abdominal pain, no acute abdomen, no vomiting, patient amenable to outpatient follow-up, with a short course of steroids for likely  inflammatory/musculoskeletal etiology.  Final Clinical Impressions(s) / ED Diagnoses   Final diagnoses:  Flank pain  Pain of left great toe     Carmin Muskrat, MD 06/30/19 1323

## 2019-06-30 NOTE — ED Notes (Signed)
Patient ambulated to restroom independently.

## 2019-06-30 NOTE — ED Triage Notes (Addendum)
Pt presents with c/o lower left side abdominal pain radiating to her left flank area. Pt reports she was seen for the same approx one month ago and was unable to obtain a definitive diagnosis. Pt reports the pain is still present. Pt also c/o sharp pain in her left big toe.

## 2019-06-30 NOTE — ED Notes (Signed)
Pt d/c home per MD order. Discharge summary reviewed with pt, pt verbalizes understanding. No s/s of acute distress noted  

## 2019-12-10 ENCOUNTER — Other Ambulatory Visit: Payer: Self-pay

## 2019-12-10 ENCOUNTER — Encounter: Payer: Self-pay | Admitting: Registered Nurse

## 2019-12-10 ENCOUNTER — Ambulatory Visit (INDEPENDENT_AMBULATORY_CARE_PROVIDER_SITE_OTHER): Payer: Self-pay | Admitting: Registered Nurse

## 2019-12-10 VITALS — BP 148/92 | HR 73 | Temp 98.2°F | Ht 64.0 in | Wt 185.4 lb

## 2019-12-10 DIAGNOSIS — M5432 Sciatica, left side: Secondary | ICD-10-CM

## 2019-12-10 DIAGNOSIS — Z13228 Encounter for screening for other metabolic disorders: Secondary | ICD-10-CM

## 2019-12-10 DIAGNOSIS — I10 Essential (primary) hypertension: Secondary | ICD-10-CM

## 2019-12-10 DIAGNOSIS — Z1329 Encounter for screening for other suspected endocrine disorder: Secondary | ICD-10-CM

## 2019-12-10 DIAGNOSIS — Z130 Encounter for screening for diseases of the blood and blood-forming organs and certain disorders involving the immune mechanism: Secondary | ICD-10-CM

## 2019-12-10 DIAGNOSIS — R109 Unspecified abdominal pain: Secondary | ICD-10-CM

## 2019-12-10 LAB — POCT URINALYSIS DIP (CLINITEK)
Bilirubin, UA: NEGATIVE
Blood, UA: NEGATIVE
Glucose, UA: NEGATIVE mg/dL
Ketones, POC UA: NEGATIVE mg/dL
Leukocytes, UA: NEGATIVE
Nitrite, UA: NEGATIVE
POC PROTEIN,UA: NEGATIVE
Spec Grav, UA: 1.01 (ref 1.010–1.025)
Urobilinogen, UA: 0.2 E.U./dL
pH, UA: 6.5 (ref 5.0–8.0)

## 2019-12-10 MED ORDER — HYDROCHLOROTHIAZIDE 25 MG PO TABS: 25.0000 mg | ORAL_TABLET | Freq: Every day | ORAL | 3 refills | Status: AC

## 2019-12-10 MED ORDER — PROPRANOLOL HCL 40 MG PO TABS: 40.0000 mg | ORAL_TABLET | Freq: Every day | ORAL | 0 refills | Status: DC

## 2019-12-10 MED ORDER — CYCLOBENZAPRINE HCL 10 MG PO TABS: 10.0000 mg | ORAL_TABLET | Freq: Every day | ORAL | 0 refills | Status: DC

## 2019-12-10 NOTE — Patient Instructions (Addendum)
    Ms Tracie Leach to meet you today. I enjoyed our talk. I share your goal of maintaining your good health for as long as possible - I am confident that we can reach this goal together.   Today, we covered a number of topics and I wanted to provide you with a brief written breakdown of our discussion:  Blood pressure: Hydrochlorothiazide: Please take two of your 12.5mg  tablets daily (25mg  total) with breakfast. I will send a refill to your pharmacy.  Propranolol: Please take 40mg  tablet once daily with breakfast. This will not only help your blood pressure, it will help prevent headaches.  Lab work: Today, we drew the following labs: BMP: basic metabolic panel. This will tell about your kidney function and some electrolytes A1c: This is a 3 month look at your blood sugars and our diagnostic tool for diabetes UA: urinalysis. This is a quick investigation into your urine and any abnormalities that may have occurred.   Foot Pain: I think this is related to your back pain - there may be some nerve compression irritating your foot. This is commonly the sciatic nerve. Please stretch 1-2x daily, and consider trying out yoga (I recommend the Yoga with [employer] YouTube channel). Also, take the flexeril muscle relaxer before bed each evening. This will help keep your muscles relaxed while you sleep. It is mildly sedative, so please do not take this during the day.  If you have any questions, please feel free to reach out!   If you have lab work done today you will be contacted with your lab results within the next 2 weeks.  If you have not heard from then please contact us. The fastest way to get your results is to register for My Chart.   IF you received an x-ray today, you will receive an invoice from [facility]. Please contact [facility] at [phone] with questions or concerns regarding your invoice.   IF you received labwork today, you will receive an  invoice from [facility]. Please contact [facility] at [phone] with questions or concerns regarding your invoice.   Our billing staff will not be able to assist you with questions regarding bills from these companies.  You will be contacted with the lab results as soon as they are available. The fastest way to get your results is to activate your My Chart account. Instructions are located on the last page of this paperwork. If you have not heard from [facility] regarding the results in 2 weeks, please contact this office.

## 2019-12-11 ENCOUNTER — Encounter: Payer: Self-pay | Admitting: Registered Nurse

## 2019-12-11 LAB — BASIC METABOLIC PANEL
BUN/Creatinine Ratio: 10 (ref 9–23)
BUN: 9 mg/dL (ref 6–24)
CO2: 23 mmol/L (ref 20–29)
Calcium: 10.1 mg/dL (ref 8.7–10.2)
Chloride: 99 mmol/L (ref 96–106)
Creatinine, Ser: 0.87 mg/dL (ref 0.57–1.00)
GFR calc Af Amer: 93 mL/min/{1.73_m2} (ref >59)
GFR calc non Af Amer: 81 mL/min/{1.73_m2} (ref >59)
Glucose: 100 mg/dL — ABNORMAL HIGH (ref 65–99)
Potassium: 3.5 mmol/L (ref 3.5–5.2)
Sodium: 140 mmol/L (ref 134–144)

## 2019-12-11 LAB — HEMOGLOBIN A1C
Est. average glucose Bld gHb Est-mCnc: 123 mg/dL
Hgb A1c MFr Bld: 5.9 % — ABNORMAL HIGH (ref 4.8–5.6)

## 2019-12-12 ENCOUNTER — Telehealth: Payer: Self-pay

## 2019-12-12 NOTE — Telephone Encounter (Signed)
Pt submitted crm to discuss lab work which was resolved. She is having pain in her feet and side, was given robaxin for the pain. Has not been on the med long enough to feel the effects. Will call if she feels the med is not working.

## 2019-12-20 NOTE — Progress Notes (Signed)
New Patient Office Visit  Subjective:  Patient ID: Tracie Leach, female    DOB: [birth year]  Age: 44 y.o. MRN: [id number]  CC:  Chief Complaint  Patient presents with  . New Patient (Initial Visit)    patient states she was told that she was pre diabetic before covid now she states she is having some foot pain , and also some hand pain, and lower stomach  . Hypertension    she states the hydrochlorothiazide    HPI Aruna Nestler presents for visit to establish care.  States that prior to covid she was told she was prediabetic - this was in ED - she has not been able to follow up with primary care singe. She is requesting labs today.  Needs refill on hctz. Has been dx with htn. Has taken it with good effect in the past. BP elevated today but denies CV symptoms. Willing to restart hctz.   Some foot pain, hand pain, lower abdominal pain, migraine type headaches. These may not be associated, she is unsure. Concerned for diabetic pain.   Otherwise no concerns  Past Medical History:  Diagnosis Date  . Hypertension     Past Surgical History:  Procedure Laterality Date  . ABDOMINAL HYSTERECTOMY    . EYE SURGERY      Family History  Problem Relation Age of Onset  . Bowel Disease Maternal Aunt   . Colon cancer Neg Hx   . Esophageal cancer Neg Hx   . Inflammatory bowel disease Neg Hx   . Liver disease Neg Hx   . Pancreatic cancer Neg Hx   . Rectal cancer Neg Hx   . Stomach cancer Neg Hx     Social History   Socioeconomic History  . Marital status: Married    Spouse name: Not on file  . Number of children: 3  . Years of education: Not on file  . Highest education level: Not on file  Occupational History  . Not on file  Tobacco Use  . Smoking status: Never Smoker  . Smokeless tobacco: Never Used  Substance and Sexual Activity  . Alcohol use: Yes    Comment: occasional-1-2 every 2 weeks  . Drug use: No  . Sexual activity: Not on file  Other Topics Concern  .  Not on file  Social History Narrative  . Not on file   Social Determinants of Health   Financial Resource Strain:   . Difficulty of Paying Living Expenses:   Food Insecurity:   . Worried About Programme researcher, broadcasting/film/video in the Last Year:   . Barista in the Last Year:   Transportation Needs:   . Freight forwarder (Medical):   Marland Kitchen Lack of Transportation (Non-Medical):   Physical Activity:   . Days of Exercise per Week:   . Minutes of Exercise per Session:   Stress:   . Feeling of Stress :   Social Connections:   . Frequency of Communication with Friends and Family:   . Frequency of Social Gatherings with Friends and Family:   . Attends Religious Services:   . Active Member of Clubs or Organizations:   . Attends Banker Meetings:   Marland Kitchen Marital Status:   Intimate Partner Violence:   . Fear of Current or Ex-Partner:   . Emotionally Abused:   Marland Kitchen Physically Abused:   . Sexually Abused:     ROS Review of Systems  Constitutional: Negative.   HENT: Negative.   Eyes:  Negative.   Respiratory: Negative.   Cardiovascular: Negative.   Gastrointestinal: Negative.   Endocrine: Negative.   Genitourinary: Negative.   Musculoskeletal: Positive for arthralgias.  Skin: Negative.   Allergic/Immunologic: Negative.   Neurological: Positive for headaches. Negative for dizziness, tremors, seizures, syncope, facial asymmetry, speech difficulty, weakness, light-headedness and numbness.  Hematological: Negative.   Psychiatric/Behavioral: Negative.   All other systems reviewed and are negative.   Objective:   Today's Vitals: BP (!) 148/92   Pulse 73   Temp 98.2 F (36.8 C) (Temporal)   Ht 5\' 4"  (1.626 m)   Wt 185 lb 6.4 oz (84.1 kg)   SpO2 98%   BMI 31.82 kg/m   Physical Exam Vitals and nursing note reviewed.  Constitutional:      General: She is not in acute distress.    Appearance: Normal appearance. She is normal weight. She is not ill-appearing, toxic-appearing  or diaphoretic.  Cardiovascular:     Rate and Rhythm: Normal rate and regular rhythm.     Pulses: Normal pulses.     Heart sounds: Normal heart sounds. No murmur. No friction rub. No gallop.   Pulmonary:     Effort: Pulmonary effort is normal. No respiratory distress.     Breath sounds: Normal breath sounds. No stridor. No wheezing, rhonchi or rales.  Chest:     Chest wall: No tenderness.  Skin:    Capillary Refill: Capillary refill takes less than 2 seconds.  Neurological:     General: No focal deficit present.     Mental Status: She is alert and oriented to person, place, and time. Mental status is at baseline.     Cranial Nerves: No cranial nerve deficit.     Sensory: No sensory deficit.     Motor: No weakness.     Coordination: Coordination normal.     Gait: Gait normal.     Deep Tendon Reflexes: Reflexes normal.  Psychiatric:        Mood and Affect: Mood normal.        Behavior: Behavior normal.        Thought Content: Thought content normal.        Judgment: Judgment normal.     Assessment & Plan:   Problem List Items Addressed This Visit    None    Visit Diagnoses    Screening for endocrine, metabolic and immunity disorder    -  Primary   Relevant Orders   Basic Metabolic Panel (Completed)   Hemoglobin A1c (Completed)   Flank pain       Relevant Medications   cyclobenzaprine (FLEXERIL) 10 MG tablet   Other Relevant Orders   POCT URINALYSIS DIP (CLINITEK) (Completed)   Essential hypertension       Relevant Medications   propranolol (INDERAL) 40 MG tablet   hydrochlorothiazide (HYDRODIURIL) 25 MG tablet   Left sciatic nerve pain       Relevant Medications   cyclobenzaprine (FLEXERIL) 10 MG tablet      Outpatient Encounter Medications as of 12/10/2019  Medication Sig  . ibuprofen (ADVIL) 200 MG tablet Take 200 mg by mouth every 6 (six) hours as needed for fever, headache or moderate pain.  . Menthol-Camphor (ICY HOT ADVANCED RELIEF) 16-11 % CREA Apply 1  application topically as needed (hand pain).  . Menthol-Methyl Salicylate (MUSCLE RUB) 10-15 % CREA Apply 1 application topically as needed for muscle pain (hand pain).  . [DISCONTINUED] hydrochlorothiazide (HYDRODIURIL) 12.5 MG tablet Take 12.5 mg by mouth daily.   Marland Kitchen  cyclobenzaprine (FLEXERIL) 10 MG tablet Take 1 tablet (10 mg total) by mouth at bedtime.  . hydrochlorothiazide (HYDRODIURIL) 25 MG tablet Take 1 tablet (25 mg total) by mouth daily.  Marland Kitchen lidocaine (LIDODERM) 5 % Place 1 patch onto the skin daily. Remove & Discard patch within 12 hours or as directed by MD (Patient not taking: Reported on 06/30/2019)  . linaclotide (LINZESS) 72 MCG capsule Take 1 capsule (72 mcg total) by mouth daily before breakfast. (Patient not taking: Reported on 01/25/2019)  . methocarbamol (ROBAXIN) 500 MG tablet Take 1 tablet (500 mg total) by mouth 2 (two) times daily. (Patient not taking: Reported on 01/25/2019)  . predniSONE (DELTASONE) 20 MG tablet Take 2 tablets (40 mg total) by mouth daily with breakfast. For the next four days (Patient not taking: Reported on 12/10/2019)  . propranolol (INDERAL) 40 MG tablet Take 1 tablet (40 mg total) by mouth daily.   No facility-administered encounter medications on file as of 12/10/2019.    Follow-up: No follow-ups on file.   PLAN  Restart hctz  Propranolol for migraine prevention  Flexeril for msk pain - should help her sleep at night  Return in 2 weeks for nurse visit BP check  Labs drawn, will follow up as warranted  Patient encouraged to call clinic with any questions, comments, or concerns.  [name], NP

## 2019-12-24 ENCOUNTER — Other Ambulatory Visit: Payer: Self-pay

## 2019-12-24 ENCOUNTER — Ambulatory Visit (INDEPENDENT_AMBULATORY_CARE_PROVIDER_SITE_OTHER): Payer: Self-pay | Admitting: Registered Nurse

## 2019-12-24 ENCOUNTER — Telehealth: Payer: Self-pay

## 2019-12-24 VITALS — BP 146/96 | HR 86

## 2019-12-24 DIAGNOSIS — Z0130 Encounter for examination of blood pressure without abnormal findings: Secondary | ICD-10-CM

## 2019-12-24 NOTE — Telephone Encounter (Signed)
Pt come in for a 2 week BP check   Automatic was 156/106 pt was very flustered and rushing coming in  After 5 min manual repeat was 146/96 and her last ov her BP was 148/92  Please advise if we have to call her with any additional recommendations.  Next OV for HTN is 01/21/20

## 2019-12-24 NOTE — Progress Notes (Signed)
Pt come in for a bp check nurse visit.

## 2020-01-01 ENCOUNTER — Telehealth: Payer: Self-pay

## 2020-01-02 ENCOUNTER — Other Ambulatory Visit: Payer: Self-pay | Admitting: Registered Nurse

## 2020-01-02 DIAGNOSIS — G5793 Unspecified mononeuropathy of bilateral lower limbs: Secondary | ICD-10-CM

## 2020-01-02 MED ORDER — GABAPENTIN 100 MG PO CAPS: 100.0000 mg | ORAL_CAPSULE | Freq: Three times a day (TID) | ORAL | 3 refills | Status: AC

## 2020-01-21 ENCOUNTER — Ambulatory Visit: Payer: Self-pay | Admitting: Registered Nurse

## 2020-01-22 ENCOUNTER — Encounter: Payer: Self-pay | Admitting: Registered Nurse

## 2020-02-16 NOTE — Telephone Encounter (Signed)
No action needed, closing encounter from inbox 

## 2020-02-19 ENCOUNTER — Other Ambulatory Visit: Payer: Self-pay

## 2020-02-19 MED ORDER — LINACLOTIDE 72 MCG PO CAPS: 72.0000 ug | ORAL_CAPSULE | Freq: Every day | ORAL | 1 refills | Status: DC

## 2020-02-24 ENCOUNTER — Encounter: Payer: Self-pay | Admitting: Registered Nurse

## 2020-02-25 ENCOUNTER — Telehealth: Payer: Self-pay | Admitting: Registered Nurse

## 2020-02-25 NOTE — Telephone Encounter (Signed)
linaclotide (LINZESS) 72 MCG capsule  Requesting samples of this, the prescription assistance program that she qualifies for takes 45-60 business days to process.

## 2020-02-26 ENCOUNTER — Telehealth: Payer: Self-pay

## 2020-02-26 ENCOUNTER — Other Ambulatory Visit: Payer: Self-pay | Admitting: Registered Nurse

## 2020-02-26 DIAGNOSIS — K5900 Constipation, unspecified: Secondary | ICD-10-CM

## 2020-02-26 MED ORDER — DICYCLOMINE HCL 20 MG PO TABS: 20.0000 mg | ORAL_TABLET | Freq: Four times a day (QID) | ORAL | 0 refills | Status: DC

## 2020-02-26 NOTE — Telephone Encounter (Addendum)
Spoke with pt, she has been approved for the rx assistance program. She is currently awaiting for a 90 day rx to be sent for linzess. She is asking for any other otc or medication alternative that can be given to her while waiting for the linzess due to the discomfort of IBS. A message was sent to her pcp. She is also bringing the rx assistance application for pcp to complete his portion.  **pcp did send bentyl to the pharmacy**  Form has been completed by pcp, copy made for pt to pick up from from office. Form will be put in scan box.

## 2020-03-06 ENCOUNTER — Other Ambulatory Visit: Payer: Self-pay | Admitting: Registered Nurse

## 2020-03-06 DIAGNOSIS — I10 Essential (primary) hypertension: Secondary | ICD-10-CM

## 2020-03-09 NOTE — Telephone Encounter (Signed)
Pt was last seen as New patient on 12/10/19.  Needs an OV for future refills.   [place] 90 day refill given since she is a Self pay pt.   PLEASE SCHEDULE PT for f/u appt for BP.

## 2020-03-09 NOTE — Telephone Encounter (Signed)
Called pt and sch f/u appt for 6/4.

## 2020-03-12 ENCOUNTER — Ambulatory Visit (INDEPENDENT_AMBULATORY_CARE_PROVIDER_SITE_OTHER): Payer: Self-pay | Admitting: Registered Nurse

## 2020-03-12 ENCOUNTER — Encounter: Payer: Self-pay | Admitting: Registered Nurse

## 2020-03-12 ENCOUNTER — Other Ambulatory Visit: Payer: Self-pay

## 2020-03-12 VITALS — BP 133/89 | HR 70 | Temp 98.0°F | Resp 17 | Ht 64.0 in | Wt 184.8 lb

## 2020-03-12 DIAGNOSIS — M79675 Pain in left toe(s): Secondary | ICD-10-CM

## 2020-03-12 DIAGNOSIS — R7309 Other abnormal glucose: Secondary | ICD-10-CM

## 2020-03-12 DIAGNOSIS — N62 Hypertrophy of breast: Secondary | ICD-10-CM

## 2020-03-12 LAB — POCT GLYCOSYLATED HEMOGLOBIN (HGB A1C): Hemoglobin A1C: 6.2 % — AB (ref 4.0–5.6)

## 2020-03-12 NOTE — Patient Instructions (Signed)
° ° ° °  If you have lab work done today you will be contacted with your lab results within the next 2 weeks.  If you have not heard from us then please contact us. The fastest way to get your results is to register for My Chart. ° ° °IF you received an x-ray today, you will receive an invoice from [facility]. Please contact [facility] at [phone] with questions or concerns regarding your invoice.  ° °IF you received labwork today, you will receive an invoice from [facility]. Please contact [facility] at [phone] with questions or concerns regarding your invoice.  ° °Our billing staff will not be able to assist you with questions regarding bills from these companies. ° °You will be contacted with the lab results as soon as they are available. The fastest way to get your results is to activate your My Chart account. Instructions are located on the last page of this paperwork. If you have not heard from us regarding the results in 2 weeks, please contact this office. °  ° ° ° °

## 2020-03-12 NOTE — Progress Notes (Signed)
Established Patient Office Visit  Subjective:  Patient ID: Tracie Leach, female    DOB: 1976/03/17  Age: 44 y.o. MRN: [id number]  CC:  Chief Complaint  Patient presents with   Follow-up    follow up on Blood pressure and discuss medication .    HPI Tracie Leach presents for BP follow up   Taking hctz 25mg  PO qd with good effect. bp wnl today. Denies cv symptoms  Still having toe pain - no clear cause or exacerbating factors. Sharp pain that starts at joint and extends towards the end of the great toe of left foot. No other toes affected, pain does not radiate. Has not worsened or improved since last visit.  Also notes that she has an interest in breast reduction surgery. Feels that her chronic back pain would be improved. Since she has gained some weight with COVID, she has been more motivated to have this done, but has thought about it for a while.   Past Medical History:  Diagnosis Date   Hypertension     Past Surgical History:  Procedure Laterality Date   ABDOMINAL HYSTERECTOMY     EYE SURGERY      Family History  Problem Relation Age of Onset   Bowel Disease Maternal Aunt    Colon cancer Neg Hx    Esophageal cancer Neg Hx    Inflammatory bowel disease Neg Hx    Liver disease Neg Hx    Pancreatic cancer Neg Hx    Rectal cancer Neg Hx    Stomach cancer Neg Hx     Social History   Socioeconomic History   Marital status: Married    Spouse name: Not on file   Number of children: 3   Years of education: Not on file   Highest education level: Not on file  Occupational History   Not on file  Tobacco Use   Smoking status: Never Smoker   Smokeless tobacco: Never Used  Substance and Sexual Activity   Alcohol use: Yes    Comment: occasional-1-2 every 2 weeks   Drug use: No   Sexual activity: Not on file  Other Topics Concern   Not on file  Social History Narrative   Not on file   Social Determinants of Health   Financial  Resource Strain:    Difficulty of Paying Living Expenses:   Food Insecurity:    Worried About in the Last Year:    Programme researcher, broadcasting/film/video in the Last Year:   Transportation Needs:    Barista (Medical):    Lack of Transportation (Non-Medical):   Physical Activity:    Days of Exercise per Week:    Minutes of Exercise per Session:   Stress:    Feeling of Stress :   Social Connections:    Frequency of Communication with Friends and Family:    Frequency of Social Gatherings with Friends and Family:    Attends Religious Services:    Active Member of Clubs or Organizations:    Attends Freight forwarder:    Marital Status:   Intimate Partner Violence:    Fear of Current or Ex-Partner:    Emotionally Abused:    Physically Abused:    Sexually Abused:     Outpatient Medications Prior to Visit  Medication Sig Dispense Refill   cyclobenzaprine (FLEXERIL) 10 MG tablet Take 1 tablet (10 mg total) by mouth at bedtime. 30 tablet 0   dicyclomine (BENTYL) 20  MG tablet Take 1 tablet (20 mg total) by mouth every 6 (six) hours. 30 tablet 0   gabapentin (NEURONTIN) 100 MG capsule Take 1 capsule (100 mg total) by mouth 3 (three) times daily. 90 capsule 3   hydrochlorothiazide (HYDRODIURIL) 25 MG tablet Take 1 tablet (25 mg total) by mouth daily. 90 tablet 3   ibuprofen (ADVIL) 200 MG tablet Take 200 mg by mouth every 6 (six) hours as needed for fever, headache or moderate pain.     linaclotide (LINZESS) 72 MCG capsule Take 1 capsule (72 mcg total) by mouth daily before breakfast. Please schedule a yearly office visit for further refills 30 capsule 1   Menthol-Camphor (ICY HOT ADVANCED RELIEF) 16-11 % CREA Apply 1 application topically as needed (hand pain).     Menthol-Methyl Salicylate (MUSCLE RUB) 10-15 % CREA Apply 1 application topically as needed for muscle pain (hand pain).     propranolol (INDERAL) 40 MG tablet TAKE 1 TABLET(40  MG) BY MOUTH DAILY 90 tablet 0   lidocaine (LIDODERM) 5 % Place 1 patch onto the skin daily. Remove & Discard patch within 12 hours or as directed by MD (Patient not taking: Reported on 06/30/2019) 30 patch 0   methocarbamol (ROBAXIN) 500 MG tablet Take 1 tablet (500 mg total) by mouth 2 (two) times daily. (Patient not taking: Reported on 01/25/2019) 20 tablet 0   predniSONE (DELTASONE) 20 MG tablet Take 2 tablets (40 mg total) by mouth daily with breakfast. For the next four days (Patient not taking: Reported on 12/10/2019) 8 tablet 0   No facility-administered medications prior to visit.    No Known Allergies  ROS Review of Systems  Constitutional: Negative.   HENT: Negative.   Eyes: Negative.   Respiratory: Negative.   Cardiovascular: Negative.   Gastrointestinal: Negative.   Endocrine: Negative.   Genitourinary: Negative.   Musculoskeletal: Negative.   Skin: Negative.   Allergic/Immunologic: Negative.   Neurological: Negative.   Hematological: Negative.   Psychiatric/Behavioral: Negative.   All other systems reviewed and are negative.     Objective:    Physical Exam  Constitutional: She is oriented to person, place, and time. She appears well-developed and well-nourished. No distress.  Cardiovascular: Normal rate and regular rhythm.  Pulmonary/Chest: Effort normal. No respiratory distress.  Neurological: She is alert and oriented to person, place, and time.  Skin: Skin is warm and dry. No rash noted. She is not diaphoretic. No erythema. No pallor.  Psychiatric: She has a normal mood and affect. Her behavior is normal. Judgment and thought content normal.  Nursing note and vitals reviewed.   BP 133/89    Pulse 70    Temp 98 F (36.7 C) (Temporal)    Resp 17    Ht 5\' 4"  (1.626 m)    Wt 184 lb 12.8 oz (83.8 kg)    SpO2 98%    BMI 31.72 kg/m  Wt Readings from Last 3 Encounters:  03/12/20 184 lb 12.8 oz (83.8 kg)  12/10/19 185 lb 6.4 oz (84.1 kg)  06/30/19 185 lb (83.9  kg)     There are no preventive care reminders to display for this patient.  There are no preventive care reminders to display for this patient.  No results found for: TSH Lab Results  Component Value Date   WBC 6.1 06/30/2019   HGB 14.1 06/30/2019   HCT 44.7 06/30/2019   MCV 88.0 06/30/2019   PLT 311 06/30/2019   Lab Results  Component Value Date  NA 140 12/10/2019   K 3.5 12/10/2019   CO2 23 12/10/2019   GLUCOSE 100 (H) 12/10/2019   BUN 9 12/10/2019   CREATININE 0.87 12/10/2019   BILITOT 0.4 06/30/2019   ALKPHOS 71 06/30/2019   AST 16 06/30/2019   ALT 13 06/30/2019   PROT 8.2 (H) 06/30/2019   ALBUMIN 4.3 06/30/2019   CALCIUM 10.1 12/10/2019   ANIONGAP 10 06/30/2019   No results found for: CHOL No results found for: HDL No results found for: LDLCALC No results found for: TRIG No results found for: CHOLHDL Lab Results  Component Value Date   HGBA1C 6.2 (A) 03/12/2020      Assessment & Plan:   Problem List Items Addressed This Visit    None    Visit Diagnoses    Elevated hemoglobin A1c    -  Primary   Relevant Orders   POCT glycosylated hemoglobin (Hb A1C) (Completed)   Large breasts       Relevant Orders   Ambulatory referral to Plastic Surgery   Pain of great toe, left       Relevant Orders   Ambulatory referral to Podiatry      No orders of the defined types were placed in this encounter.   Follow-up: No follow-ups on file.   PLAN  Refer to podiatry  Refer to plastic surgery for breast reduction consult.  A1c slightly up to 6.2 - diet and exercise for control.  Patient encouraged to call clinic with any questions, comments, or concerns.  Maximiano Coss, NP

## 2020-03-16 ENCOUNTER — Other Ambulatory Visit: Payer: Self-pay | Admitting: Registered Nurse

## 2020-03-16 ENCOUNTER — Encounter (HOSPITAL_COMMUNITY): Payer: Self-pay | Admitting: Emergency Medicine

## 2020-03-16 ENCOUNTER — Emergency Department (HOSPITAL_COMMUNITY)
Admission: EM | Admit: 2020-03-16 | Discharge: 2020-03-16 | Disposition: A | Discharge status: Home / Self Care | Payer: Self-pay | Attending: Emergency Medicine | Admitting: Emergency Medicine

## 2020-03-16 DIAGNOSIS — K5900 Constipation, unspecified: Secondary | ICD-10-CM

## 2020-03-16 DIAGNOSIS — Z5321 Procedure and treatment not carried out due to patient leaving prior to being seen by health care provider: Secondary | ICD-10-CM | POA: Insufficient documentation

## 2020-03-16 DIAGNOSIS — M545 Low back pain: Secondary | ICD-10-CM | POA: Insufficient documentation

## 2020-03-16 DIAGNOSIS — R1032 Left lower quadrant pain: Secondary | ICD-10-CM | POA: Insufficient documentation

## 2020-03-16 MED ORDER — DICYCLOMINE HCL 20 MG PO TABS: 20.0000 mg | ORAL_TABLET | Freq: Four times a day (QID) | ORAL | 0 refills | Status: DC

## 2020-03-16 MED ORDER — SODIUM CHLORIDE 0.9% FLUSH: 3.0000 mL | Freq: Once | INTRAVENOUS | Status: DC

## 2020-03-16 NOTE — ED Triage Notes (Signed)
Pt c/o llq pains and lower back for a couple weeks. PCP gave Bentyl. Pt reports having trouble with bowels moving. Did enema this morning and had little but still feels backed up. Stomach feels bloated.

## 2020-03-17 ENCOUNTER — Other Ambulatory Visit: Payer: Self-pay

## 2020-03-17 ENCOUNTER — Emergency Department (HOSPITAL_COMMUNITY)
Admission: EM | Admit: 2020-03-17 | Discharge: 2020-03-17 | Disposition: A | Discharge status: Home / Self Care | Payer: Self-pay | Attending: Emergency Medicine | Admitting: Emergency Medicine

## 2020-03-17 ENCOUNTER — Telehealth: Payer: Self-pay

## 2020-03-17 ENCOUNTER — Encounter (HOSPITAL_COMMUNITY): Payer: Self-pay

## 2020-03-17 DIAGNOSIS — K5900 Constipation, unspecified: Secondary | ICD-10-CM | POA: Insufficient documentation

## 2020-03-17 DIAGNOSIS — Z79899 Other long term (current) drug therapy: Secondary | ICD-10-CM | POA: Insufficient documentation

## 2020-03-17 DIAGNOSIS — R1030 Lower abdominal pain, unspecified: Secondary | ICD-10-CM

## 2020-03-17 DIAGNOSIS — I10 Essential (primary) hypertension: Secondary | ICD-10-CM | POA: Insufficient documentation

## 2020-03-17 NOTE — ED Provider Notes (Signed)
[facility] DEPT Provider Note   CSN: [id number] Arrival date & time: 03/17/20  3875     History Chief Complaint  Patient presents with  . Abdominal Pain    Tracie Leach is a 44 y.o. female.  HPI    44 year old female comes in a chief complaint of abdominal pain.  She has history of constipation, abdominal pain is status post complete hysterectomy.  Patient reports that she has had off-and-on left-sided abdominal pain for the last several months.  There is no specific evoking, aggravating or relieving factors.  She has mentioned the symptoms to her PCP, but there has not been any specific work-up with that complaint that has been completed.  Over the last 2 to 3 weeks she has been having constipation.  She has been having inconsistent BM, low volume.  She has no associated nausea, vomiting and has been passing flatus.  Patient had similar issues 2 years ago when she had come to the ER and required disimpaction.  Patient had seen GI since then and was started on Linzess, but she was not able to afford the medication once the samples ran out.  Patient did not get a colonoscopy.  There is no family history of colon cancer.  Patient denies any unexplained weight loss, night sweats.  She has no history of kidney stones/uti sx. the hysterectomy was completed because of fibroids.  Past Medical History:  Diagnosis Date  . Hypertension     Patient Active Problem List   Diagnosis Date Noted  . Constipation 07/25/2018  . Change in bowel habits 07/25/2018  . Bloating 07/25/2018    Past Surgical History:  Procedure Laterality Date  . ABDOMINAL HYSTERECTOMY    . EYE SURGERY       OB History   No obstetric history on file.     Family History  Problem Relation Age of Onset  . Bowel Disease Maternal Aunt   . Colon cancer Neg Hx   . Esophageal cancer Neg Hx   . Inflammatory bowel disease Neg Hx   . Liver disease Neg Hx   . Pancreatic cancer Neg Hx     . Rectal cancer Neg Hx   . Stomach cancer Neg Hx     Social History   Tobacco Use  . Smoking status: Never Smoker  . Smokeless tobacco: Never Used  Substance Use Topics  . Alcohol use: Yes    Comment: occasional-1-2 every 2 weeks  . Drug use: No    Home Medications Prior to Admission medications   Medication Sig Start Date End Date Taking? Authorizing Provider  dicyclomine (BENTYL) 20 MG tablet Take 1 tablet (20 mg total) by mouth every 6 (six) hours. 03/16/20  Yes Maximiano Coss, NP  gabapentin (NEURONTIN) 100 MG capsule Take 1 capsule (100 mg total) by mouth 3 (three) times daily. 01/02/20  Yes Maximiano Coss, NP  hydrochlorothiazide (HYDRODIURIL) 25 MG tablet Take 1 tablet (25 mg total) by mouth daily. 12/10/19  Yes Maximiano Coss, NP  ibuprofen (ADVIL) 200 MG tablet Take 200 mg by mouth every 6 (six) hours as needed for fever, headache or moderate pain.   Yes [provider]  Menthol-Camphor (ICY HOT ADVANCED RELIEF) 16-11 % CREA Apply 1 application topically as needed (hand pain).   Yes [provider]  Menthol-Methyl Salicylate (MUSCLE RUB) 10-15 % CREA Apply 1 application topically as needed for muscle pain (hand pain).   Yes [provider]  propranolol (INDERAL) 40 MG tablet TAKE  1 TABLET(40 MG) BY MOUTH DAILY Patient taking differently: Take 40 mg by mouth every evening.  03/09/20  Yes [name], NP  cyclobenzaprine (FLEXERIL) 10 MG tablet Take 1 tablet (10 mg total) by mouth at bedtime. Patient not taking: Reported on 03/17/2020 12/10/19   [name], NP  lidocaine (LIDODERM) 5 % Place 1 patch onto the skin daily. Remove & Discard patch within 12 hours or as directed by MD Patient not taking: Reported on 06/30/2019 01/25/19   Couture, Cortni S, PA-C  linaclotide (LINZESS) 72 MCG capsule Take 1 capsule (72 mcg total) by mouth daily before breakfast. Please schedule a yearly office visit for further refills Patient not taking: Reported on 03/17/2020  02/19/20   Mansouraty, Netty Starring., MD  methocarbamol (ROBAXIN) 500 MG tablet Take 1 tablet (500 mg total) by mouth 2 (two) times daily. Patient not taking: Reported on 01/25/2019 11/04/18   Jeannie Fend, PA-C  predniSONE (DELTASONE) 20 MG tablet Take 2 tablets (40 mg total) by mouth daily with breakfast. For the next four days Patient not taking: Reported on 12/10/2019 06/30/19   Gerhard Munch, MD    Allergies    Patient has no known allergies.  Review of Systems   Review of Systems  Constitutional: Positive for activity change.  Respiratory: Negative for shortness of breath.   Cardiovascular: Negative for chest pain.  Gastrointestinal: Positive for abdominal pain. Negative for nausea and vomiting.  Genitourinary: Positive for flank pain.  All other systems reviewed and are negative.   Physical Exam Updated Vital Signs BP (!) 157/100   Pulse (!) 58   Temp 98.6 F (37 C) (Oral)   Resp 15   SpO2 100%   Physical Exam Vitals and nursing note reviewed.  Constitutional:      Appearance: She is well-developed.  HENT:     Head: Normocephalic and atraumatic.  Cardiovascular:     Rate and Rhythm: Normal rate.  Pulmonary:     Effort: Pulmonary effort is normal.  Abdominal:     General: Bowel sounds are normal.     Tenderness: There is abdominal tenderness in the left lower quadrant. There is no guarding or rebound.  Genitourinary:    Comments: Digital rectal exam revealed no stool in the rectal vault.  Patient did have mild bump over the 6:00 area of the rectum during the rectal exam.  It was not boggy or fluctuant. Musculoskeletal:     Cervical back: Normal range of motion and neck supple.  Skin:    General: Skin is warm and dry.  Neurological:     Mental Status: She is alert and oriented to person, place, and time.     ED Results / Procedures / Treatments   Labs (all labs ordered are listed, but only abnormal results are displayed) Labs Reviewed - No data to  display  EKG None  Radiology No results found.  Procedures Procedures (including critical care time)  Medications Ordered in ED Medications - No data to display  ED Course  I have reviewed the triage vital signs and the nursing notes.  Pertinent labs & imaging results that were available during my care of the patient were reviewed by me and considered in my medical decision making (see chart for details).    MDM Rules/Calculators/A&P                      44 year old female comes in a chief complaint of ongoing abdominal pain and worsening constipation over  the last 2 weeks.  She has history of abdominal hysterectomy, hypertension and chronic constipation.  I reviewed patient's CT scan from 2019 and her visit with GI doctor subsequently.  It appears that patient is having unspecified constipation and the plan was to increase her Linzess if she was not responding or go to colonoscopy.  Patient was lost to follow-up due to lack of insurance.  It appears that the Linzess did help her for a short amount of time and she was doing well until more recently.  She is clinically not having fecal impaction.  Perhaps there is a mechanical, outlet issue leading to constipation, as patient did have a bump on my digital rectal exam and she feels like stool is getting stuck during defecation.  I have advised that she take GoLYTELY -but it is prudent for her to follow-up with a GI doctor.  I have sent in epic message to Dr Meridee Score and social worker Anola Gurney to assist with close follow-up and possibly medication refills.  I think the abdominal pain is likely unrelated.  It is a concern to the patient as the pain has been present now for several months.  I will send a message to her PCP, NP [name] about this visit and to see if she would need advanced imaging or further work-up for her left-sided flank pain.  There is no clinical concerns for emergent pathology such as perforated viscus,  surgical emergency, AAA or conditions like kidney stone.  Final Clinical Impression(s) / ED Diagnoses Final diagnoses:  Constipation, unspecified constipation type  Lower abdominal pain    Rx / DC Orders ED Discharge Orders    None       Derwood Kaplan, MD 03/17/20 1046

## 2020-03-17 NOTE — Telephone Encounter (Signed)
Patient contacted and office visit scheduled.  In the past she has been on linzess.  She is requesting samples.  Okay to provide some linzess samples until the OV on 7/1?

## 2020-03-17 NOTE — Telephone Encounter (Signed)
-----   Message from Tracie Leach., MD sent at 03/17/2020 11:14 AM EDT ----- Dr. Brunilda Payor you for reaching out.I will work on having the patient get back to clinic with myself or one of our APP's as we have availability in the coming weeks.Hopefully nothing has progressed since my last digital rectal which was in 2019 but certainly we will want to and likely merit need for endoscopic evaluation.  She had not gotten it back in 2019 because everything was being paid for as a self-pay shoe as she did not have insurance.Hopefully there is some way we would be able to get her some insurance or Medicaid to help with medicines. [name] or covering RN, please get this patient scheduled for follow-up with myself or one of the APP's in the next few weeks as able.We likely will need to consider a colonoscopy.She does not have insurance we will need to also discuss with her potential role of financial assistance but potentially an anoscopy in the office can help better discern things at the time and make sure were not missing something very significant.  Thank you all.GM ----- Message ----- From: Tracie Kaplan, MD Sent: 03/17/2020   9:58 AM EDT To: Tracie Bickers, RN, #  Hello everyone,  I seen Tracie Leach in the ER today.  She has been having some left-sided abdominal pain for the last several months and over the last 2 or 3 weeks having difficulty in defecating.  She is followed up by Tracie Agee, NP, primarily.  Looks like constipation has been addressed with medications but she has not had significant improvement.  She has seen Dr. Meridee Leach in the past, and there was discussion that she might need to go up on Linzess or get a diagnostic colonoscopy.  I think she merits a follow-up with GI.  She need a repeat rectal exam. I suspected mass on my exam - so I am concerned she might have some mechanical obstruction. The left sided pain is likely unrelated.  Dr. Meridee Leach - will you be able to  get her back into your clinic?  Tracie Leach - She doesn't have insurance. She works as an Event organiser. Will you be able to assist with any financial help and medication refill?  Thank you all,  Tracie Leach

## 2020-03-17 NOTE — Discharge Instructions (Addendum)
You are seen in the ER for abdominal pain and constipation. Take the GoLYTELY as prescribed to see if it alleviates your constipation.  This is a strongest agent we can prescribe for constipation from the ED.  Based on exam, it seems like you will need to be seen by GI doctor for further evaluation of the constipation.  There could be a mechanical/obstructive issue leading to the constipation.  GI doctor should be able to help with the diagnosis and also management of the symptoms.  I have sent a message to Dr. Meridee Score and social worker to see if they can assist you with refill of medications.  You will also need to see your primary care doctor for further evaluation of the abdominal pain.  As discussed, they should be able to eventually consider advanced imaging or consultation with specialty service to get to the diagnosis of this problem or process.

## 2020-03-17 NOTE — ED Triage Notes (Signed)
Pt arrives POV from home with complaints of left sided abdominal pain. Pt reports that she has ongoing pain since 2019. Pt states "I have come to the ED for this before and yall keep telling me there is nothing wrong" Pt reports she has not followed outpatient recently.  Pt reports she has issues with constipation. Pt reports lower back pain as well.

## 2020-03-18 NOTE — Progress Notes (Signed)
TOC CM referral received to follow up on appt with Gastroenterologist. Pt has an appt on 7/1 at [facility]. Attempted call to pt to follow up, left message HIPAA compliant message for return call. ED provider updated. Isidoro Donning RN CCM, [facility] ED TOC CM [phone]

## 2020-04-08 ENCOUNTER — Encounter: Payer: Self-pay | Admitting: Physician Assistant

## 2020-04-08 ENCOUNTER — Ambulatory Visit (INDEPENDENT_AMBULATORY_CARE_PROVIDER_SITE_OTHER): Payer: Self-pay | Admitting: Physician Assistant

## 2020-04-08 VITALS — BP 142/84 | HR 78 | Ht 64.0 in | Wt 182.0 lb

## 2020-04-08 DIAGNOSIS — K581 Irritable bowel syndrome with constipation: Secondary | ICD-10-CM

## 2020-04-08 DIAGNOSIS — K5900 Constipation, unspecified: Secondary | ICD-10-CM

## 2020-04-08 MED ORDER — DICYCLOMINE HCL 10 MG PO CAPS: 10.0000 mg | ORAL_CAPSULE | Freq: Two times a day (BID) | ORAL | 1 refills | Status: AC | PRN

## 2020-04-08 NOTE — Patient Instructions (Signed)
We have sent 10 mg bentyl to your pharmacy to use only as needed   Do a Miralax purge if needed next week  Amy Estrwood, PA-C recommends that you complete a bowel purge (to clean out your bowels). Please do the following: Purchase a bottle of Miralax over the counter as well as a box of 5 mg dulcolax tablets. Take 4 dulcolax tablets. Wait 1 hour. You will then drink 6-8 capfuls of Miralax mixed in an adequate amount of water/juice/gatorade (you may choose which of these liquids to drink) over the next 2-3 hours. You should expect results within 1 to 6 hours after completing the bowel purge.  We have given you Linzess 72 mcg samples today  Schedule a follow up in 3 months with Amy Esterwood,PA-C or Dr Meridee Score to discuss scheduling a colonoscopy once you have medical insurance   Thank you for choosing [facility]  Amy Myrtie Hawk

## 2020-04-13 ENCOUNTER — Encounter: Payer: Self-pay | Admitting: Physician Assistant

## 2020-04-13 NOTE — Progress Notes (Signed)
Subjective:    Patient ID: Tracie Leach, female    DOB: October 20, 1975, 44 y.o.   MRN: [id number]  HPI Noga is a pleasant 44 year old African-American female, established with Dr. Meridee Score, and last seen in 2019.  She has history of chronic constipation, and had a fecal impaction a in 2019.  She comes in today with ongoing complaints of constipation which flared again a few months ago. She had previously been given a trial of Linzess 72 mcg daily and says that this worked very well for her, however she was unable to afford the medication. Interestingly after she got her bowels well evacuated with Linzess she says she really did not have any significant problems until about 2-1/2 months ago.  She says she had been drinking a lot of "Slim tea" and is convinced that an ingredient in the Slim tea exacerbated her constipation.  She says she got very constipated and was unable to have a bowel movement for multiple days.  Now she is requiring a suppository about every third day in order to produce a bowel movement and will only pass a small amount of stool.  She has not noticed any melena or hematochezia.  She does feel bloated, "tight" and uncomfortable.  She has had some discomfort in her lower abdomen, no fever or chills, no melena or hematochezia. She had been given a prescription for Bentyl by her PCP and has been using that fairly regularly 2-3 times daily. No prior colonoscopy.  Patient had recently lost her job and currently does not have any insurance.  Review of Systems Pertinent positive and negative review of systems were noted in the above HPI section.  All other review of systems was otherwise negative.  Outpatient Encounter Medications as of 04/08/2020  Medication Sig  . gabapentin (NEURONTIN) 100 MG capsule Take 1 capsule (100 mg total) by mouth 3 (three) times daily.  . hydrochlorothiazide (HYDRODIURIL) 25 MG tablet Take 1 tablet (25 mg total) by mouth daily.  Marland Kitchen ibuprofen (ADVIL) 200 MG  tablet Take 200 mg by mouth every 6 (six) hours as needed for fever, headache or moderate pain.  . Menthol-Camphor (ICY HOT ADVANCED RELIEF) 16-11 % CREA Apply 1 application topically as needed (hand pain).  . Menthol-Methyl Salicylate (MUSCLE RUB) 10-15 % CREA Apply 1 application topically as needed for muscle pain (hand pain).  . propranolol (INDERAL) 40 MG tablet TAKE 1 TABLET(40 MG) BY MOUTH DAILY (Patient taking differently: Take 40 mg by mouth every evening. )  . [DISCONTINUED] dicyclomine (BENTYL) 20 MG tablet Take 1 tablet (20 mg total) by mouth every 6 (six) hours.  Marland Kitchen dicyclomine (BENTYL) 10 MG capsule Take 1 capsule (10 mg total) by mouth 2 (two) times daily as needed for spasms. For abdominal cramping  . [DISCONTINUED] cyclobenzaprine (FLEXERIL) 10 MG tablet Take 1 tablet (10 mg total) by mouth at bedtime. (Patient not taking: Reported on 03/17/2020)  . [DISCONTINUED] lidocaine (LIDODERM) 5 % Place 1 patch onto the skin daily. Remove & Discard patch within 12 hours or as directed by MD (Patient not taking: Reported on 06/30/2019)  . [DISCONTINUED] linaclotide (LINZESS) 72 MCG capsule Take 1 capsule (72 mcg total) by mouth daily before breakfast. Please schedule a yearly office visit for further refills (Patient not taking: Reported on 03/17/2020)  . [DISCONTINUED] methocarbamol (ROBAXIN) 500 MG tablet Take 1 tablet (500 mg total) by mouth 2 (two) times daily. (Patient not taking: Reported on 01/25/2019)  . [DISCONTINUED] predniSONE (DELTASONE) 20 MG tablet Take 2  tablets (40 mg total) by mouth daily with breakfast. For the next four days (Patient not taking: Reported on 12/10/2019)   No facility-administered encounter medications on file as of 04/08/2020.   No Known Allergies Patient Active Problem List   Diagnosis Date Noted  . Constipation 07/25/2018  . Change in bowel habits 07/25/2018  . Bloating 07/25/2018   Social History   Socioeconomic History  . Marital status: Married    Spouse  name: Not on file  . Number of children: 3  . Years of education: Not on file  . Highest education level: Not on file  Occupational History  . Not on file  Tobacco Use  . Smoking status: Never Smoker  . Smokeless tobacco: Never Used  Substance and Sexual Activity  . Alcohol use: Yes    Comment: occasional-1-2 every 2 weeks  . Drug use: No  . Sexual activity: Not on file  Other Topics Concern  . Not on file  Social History Narrative  . Not on file   Social Determinants of Health   Financial Resource Strain:   . Difficulty of Paying Living Expenses:   Food Insecurity:   . Worried About Programme researcher, broadcasting/film/video in the Last Year:   . Barista in the Last Year:   Transportation Needs:   . Freight forwarder (Medical):   Marland Kitchen Lack of Transportation (Non-Medical):   Physical Activity:   . Days of Exercise per Week:   . Minutes of Exercise per Session:   Stress:   . Feeling of Stress :   Social Connections:   . Frequency of Communication with Friends and Family:   . Frequency of Social Gatherings with Friends and Family:   . Attends Religious Services:   . Active Member of Clubs or Organizations:   . Attends Banker Meetings:   Marland Kitchen Marital Status:   Intimate Partner Violence:   . Fear of Current or Ex-Partner:   . Emotionally Abused:   Marland Kitchen Physically Abused:   . Sexually Abused:     Ms. Lora family history includes Bowel Disease in her maternal aunt.      Objective:    Vitals:   04/08/20 1413  BP: (!) 142/84  Pulse: 78    Physical Exam Well-developed well-nourished AA female in no acute distress.  Height, [id number], BMI 31.2  HEENT; nontraumatic normocephalic, EOMI, PER R LA, sclera anicteric. Oropharynx; not examined Neck; supple, no JVD Cardiovascular; regular rate and rhythm with S1-S2, no murmur rub or gallop Pulmonary; Clear bilaterally Abdomen; soft, there is some mild tenderness in the left lower quadrant, no guarding nondistended,  no palpable mass or hepatosplenomegaly, bowel sounds are active Rectal; not done today Skin; benign exam, no jaundice rash or appreciable lesions Extremities; no clubbing cyanosis or edema skin warm and dry Neuro/Psych; alert and oriented x4, grossly nonfocal mood and affect appropriate       Assessment & Plan:   #54 44 year old African-American female with history of chronic constipation.  She had been given a short trial of Linzess in 2019 with good results but was unable to afford the medication longer-term. She is actually been doing well over the past 2 years until about 2 months ago when she had an exacerbation of constipation which is now been ongoing.  She is requiring suppositories every 3 days to produce any bowel movement and is not evacuating her bowel well.  Plan; Have asked her to discontinue Bentyl for now as this may  be aggravating the constipation Liberal water intake at least 60 ounces per day, higher fiber diet was discussed. Restart Linzess 72 mcg daily as a trial, she was given several boxes of samples.  She has just learned today that she was excepted for a new position.  She will call back once her insurance gets and stated for prescription for Linzess 72 mcg daily. We discussed a MiraLAX purge and she was given instructions for MiraLAX purge.  She says over the past couple of days she is actually been having better bowel movements.  If she does not have good results with Linzess over the next few days she will proceed with the MiraLAX purge and then resume Linzess. Patient is asked to follow-up in about 3 months, and at that time once insurance has been instated we will get her scheduled for colonoscopy for screening with Dr. Meridee Score  Illeana Edick Oswald Hillock PA-C 04/13/2020   Cc: [name], NP

## 2020-04-17 NOTE — Progress Notes (Signed)
Attending Physician's Attestation   I have taken an interval history, reviewed the chart and examined the patient.   I agree with the Advanced Practitioner's note, impression, and recommendations with updates and my documentation above.   Isis Costanza Mansouraty, MD [facility] Advanced Endoscopy Office # [phone]  

## 2020-04-28 ENCOUNTER — Institutional Professional Consult (permissible substitution): Payer: Self-pay | Admitting: Plastic Surgery

## 2020-05-20 IMAGING — CR DG HIP (WITH OR WITHOUT PELVIS) 2-3V*L*
4 series · 4 of 4 positions shown · non-contrast
Comparison: None.

CLINICAL DATA: Chronic left hip pain after injury several years
ago.

EXAM:
DG HIP (WITH OR WITHOUT PELVIS) 2-3V LEFT

[t pelvis ap (1 of 3)]
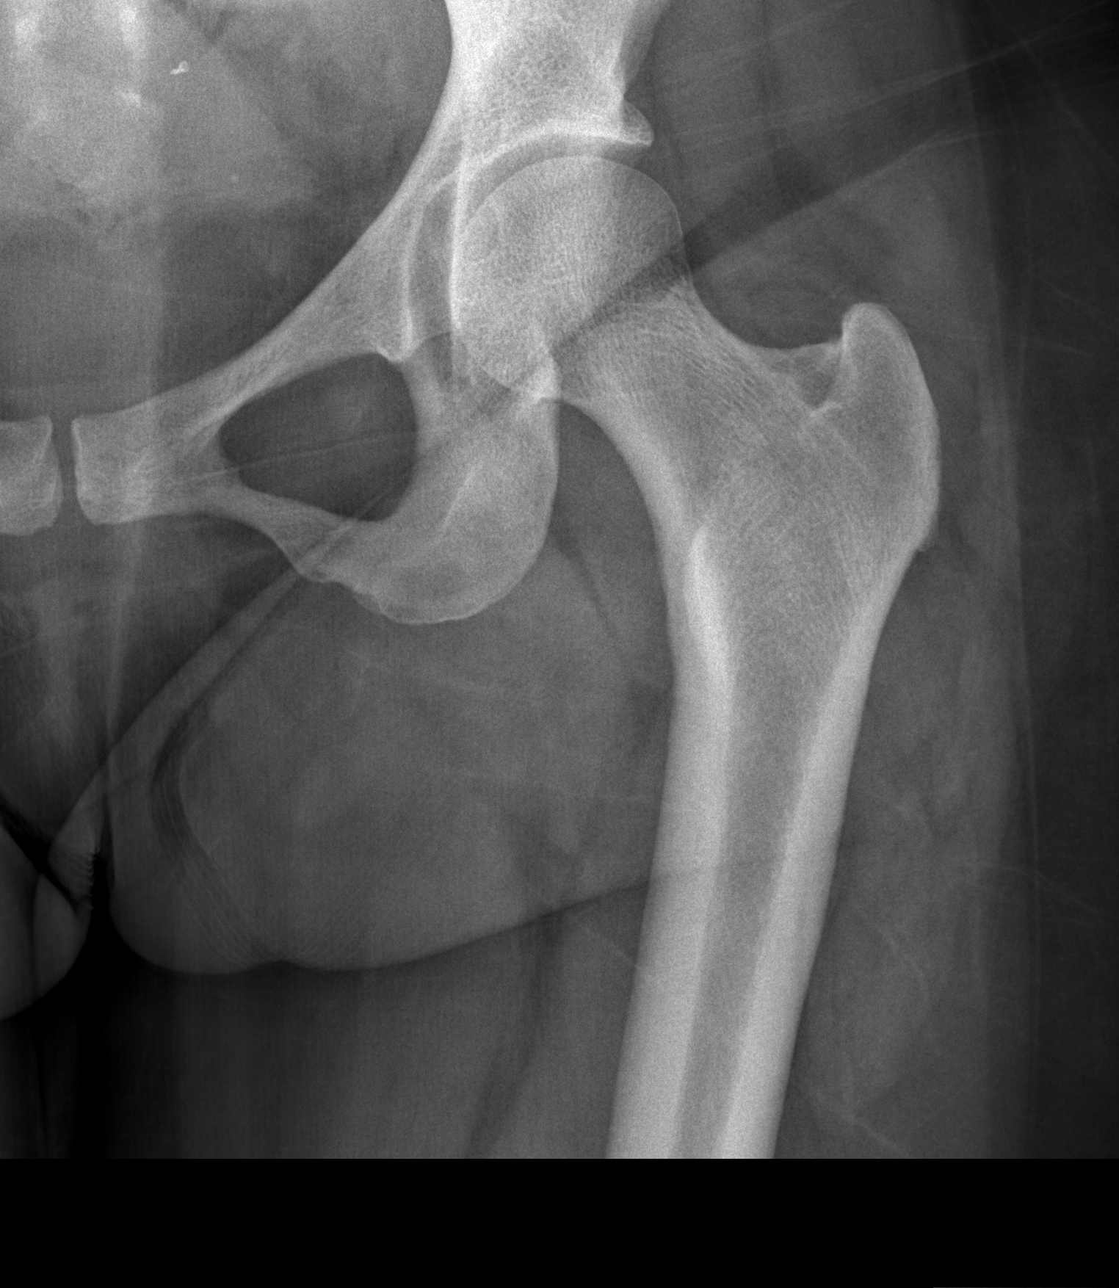

[t pelvis ap (2 of 3)]
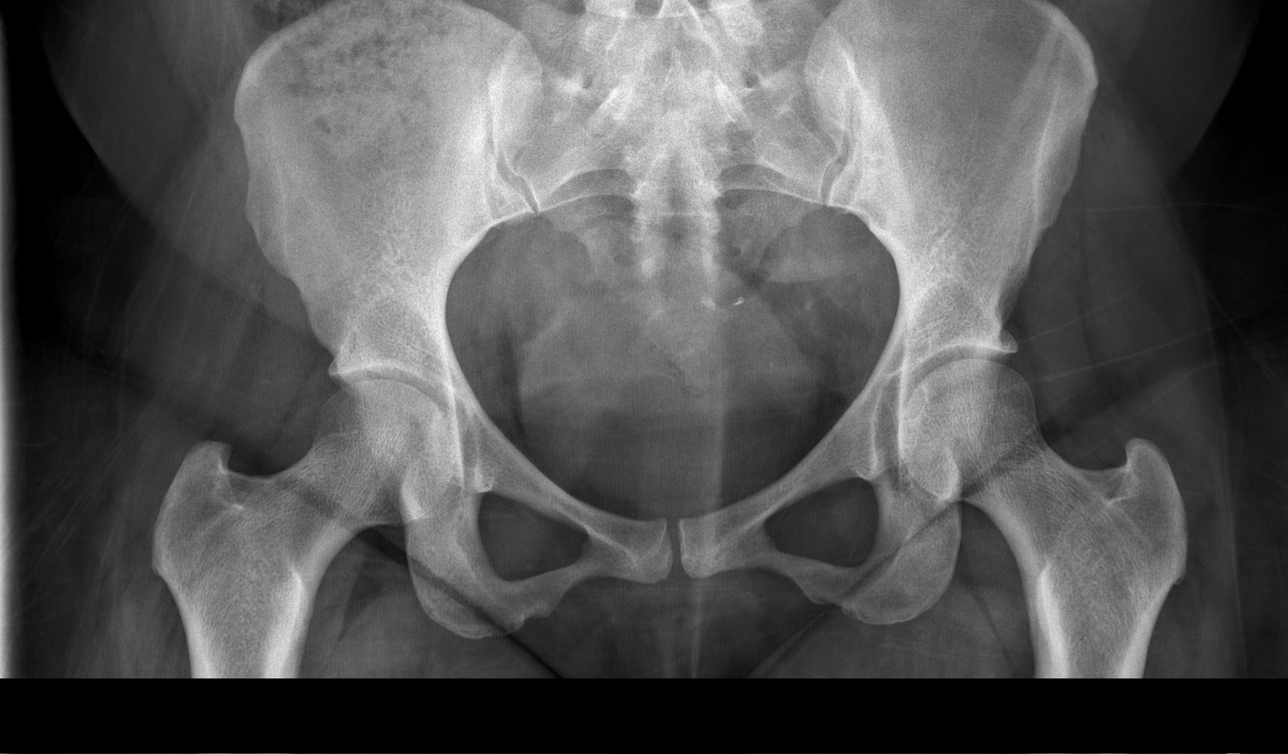

[t pelvis ap (3 of 3)]
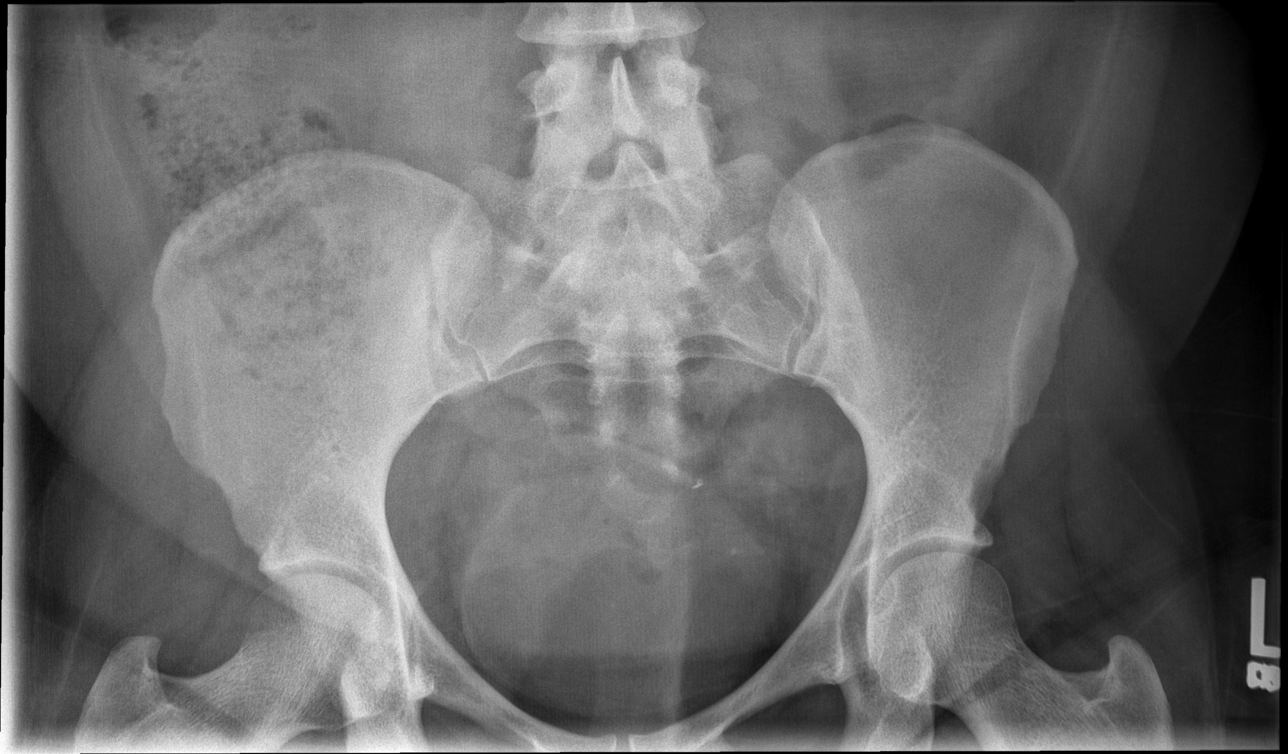

[t hip frog leg left]
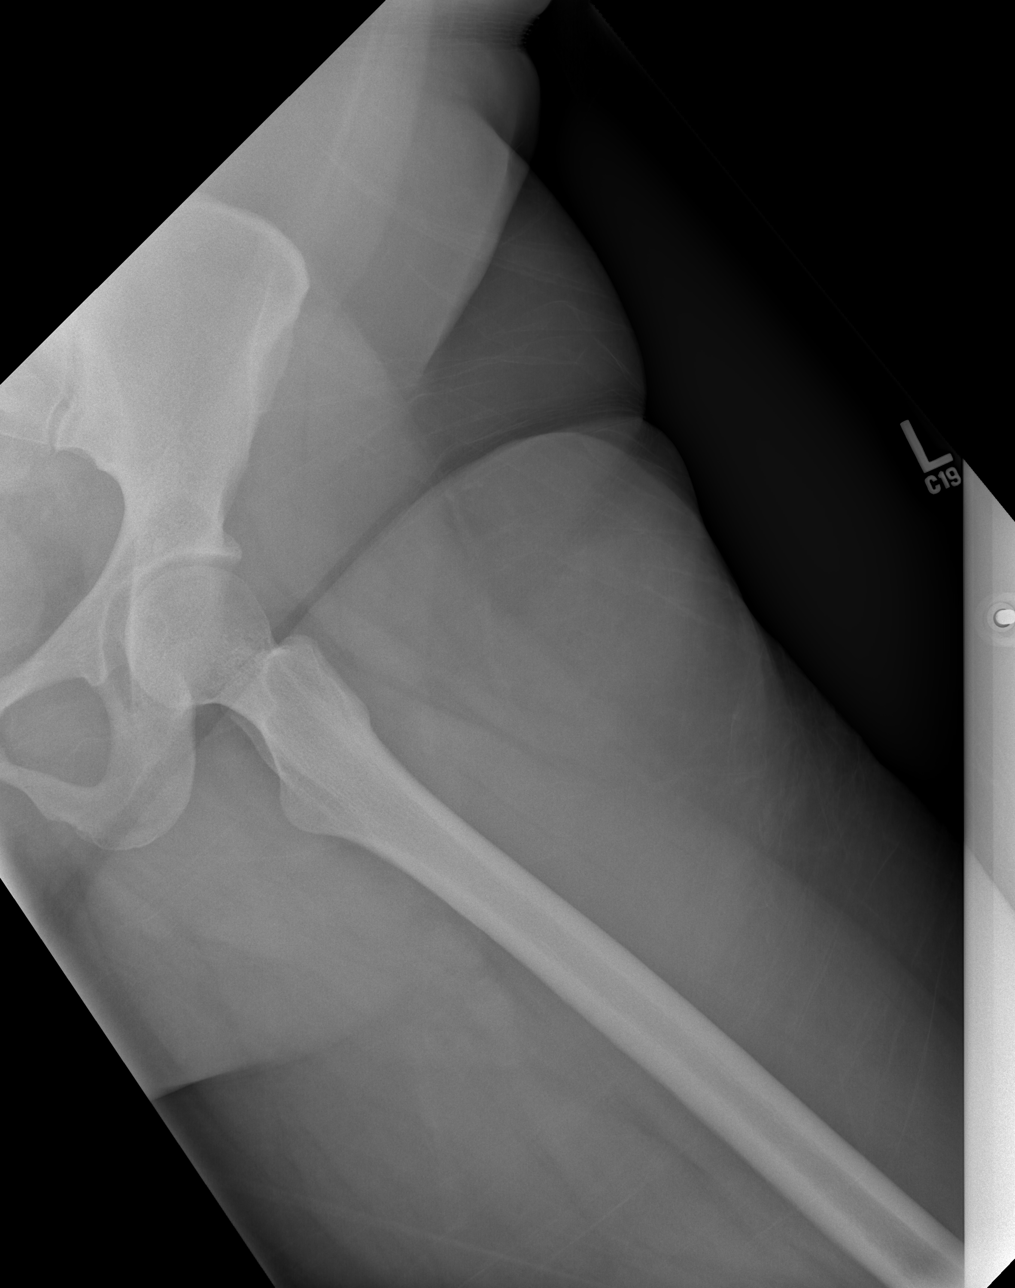

[4 of 4 positions shown; findings below may reference images not displayed]

FINDINGS: There is no evidence of hip fracture or dislocation. There is no
evidence of arthropathy or other focal bone abnormality.
IMPRESSION: Negative.

## 2020-05-20 IMAGING — CR DG TOE GREAT 2+V*L*
3 series · 3 of 3 positions shown · non-contrast
Comparison: None.

CLINICAL DATA: Left great toe pain for 6 years.

EXAM:
LEFT GREAT TOE

[x toes ap left]
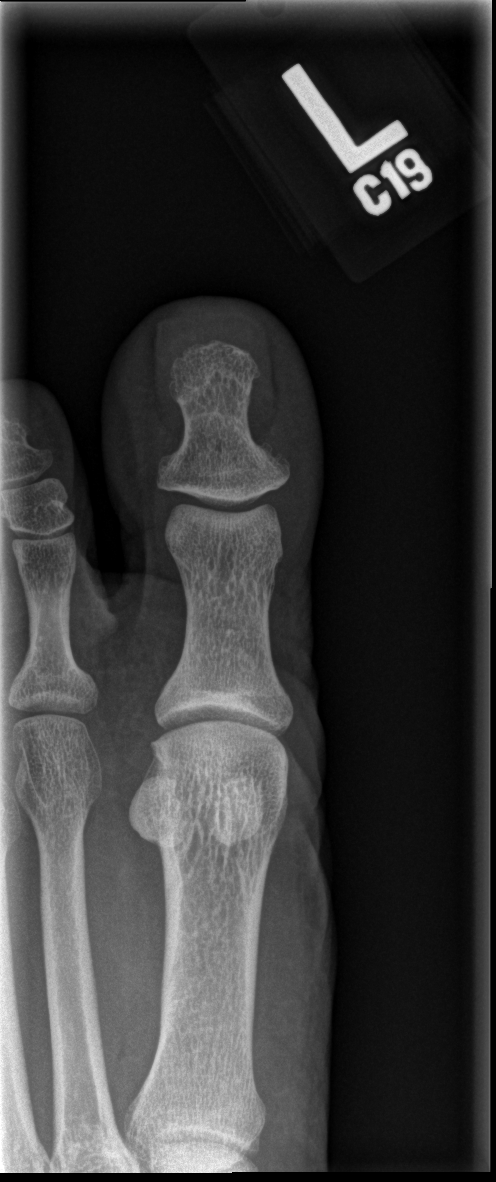

[x toes obl left]
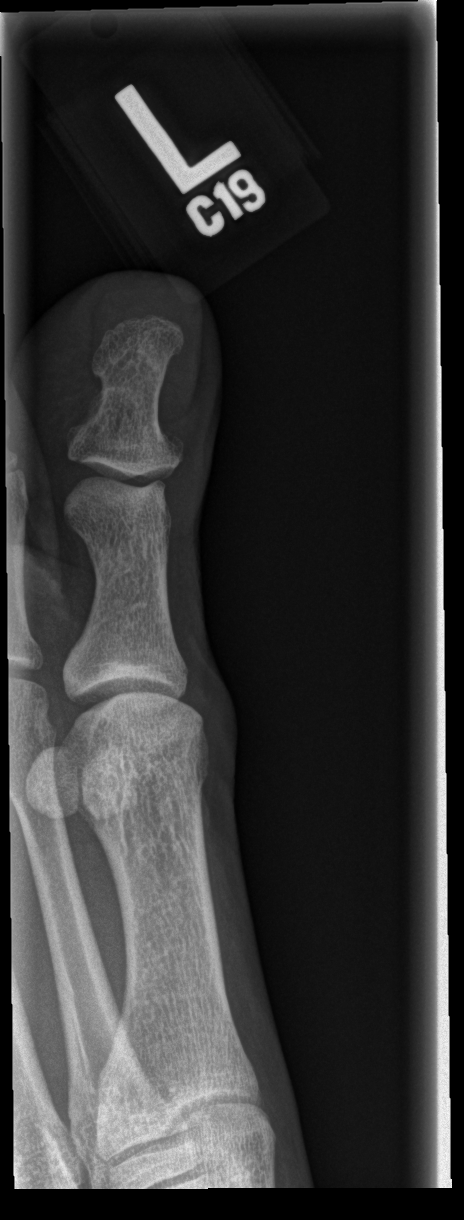

[x toes lat left]
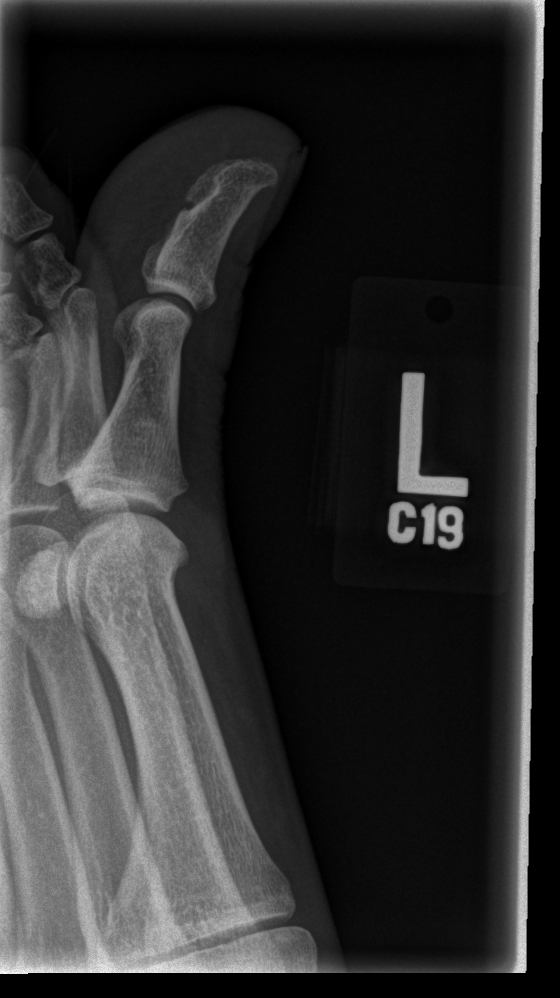

[3 of 3 positions shown; findings below may reference images not displayed]

FINDINGS: There is no evidence of fracture or dislocation. There is no
evidence of arthropathy or other focal bone abnormality. Soft
tissues are unremarkable.
IMPRESSION: Negative.

## 2020-06-03 ENCOUNTER — Institutional Professional Consult (permissible substitution): Payer: Self-pay | Admitting: Plastic Surgery

## 2020-06-08 ENCOUNTER — Other Ambulatory Visit: Payer: Self-pay | Admitting: Registered Nurse

## 2020-06-08 DIAGNOSIS — I10 Essential (primary) hypertension: Secondary | ICD-10-CM

## 2020-06-09 ENCOUNTER — Other Ambulatory Visit: Payer: Self-pay | Admitting: Registered Nurse

## 2020-06-09 DIAGNOSIS — I10 Essential (primary) hypertension: Secondary | ICD-10-CM

## 2020-07-21 ENCOUNTER — Institutional Professional Consult (permissible substitution): Payer: Self-pay | Admitting: Plastic Surgery

## 2020-09-10 ENCOUNTER — Ambulatory Visit: Payer: Self-pay | Admitting: Registered Nurse

## 2020-09-14 ENCOUNTER — Other Ambulatory Visit: Payer: Self-pay | Admitting: Registered Nurse

## 2020-09-14 DIAGNOSIS — I10 Essential (primary) hypertension: Secondary | ICD-10-CM

## 2020-09-14 NOTE — Telephone Encounter (Signed)
Requested medications are due for refill today yes  Requested medications are on the active medication list yes  Last refill 8/31  Last visit 12/2019  Future visit scheduled No, canceled appt that was scheduled for 12/3  Notes to clinic Failed protocol due to no valid visit within 6  months, no upcoming appt

## 2020-09-14 NOTE — Telephone Encounter (Signed)
Pt needs OV for additional refills. Refilled medication for 30 days. 

## 2020-09-14 NOTE — Telephone Encounter (Signed)
Pt has left the state and is needing this refill sent to [facility] [address] [place], [place] [address]. Pt stated she told a nurse to put this in her chart. Please advise.

## 2020-12-27 ENCOUNTER — Encounter: Payer: Self-pay | Admitting: Registered Nurse

## 2021-01-11 ENCOUNTER — Encounter: Admit: 2021-01-11 | Payer: PRIVATE HEALTH INSURANCE | Attending: Family | Primary: Family Medicine

## 2021-01-11 DIAGNOSIS — M79675 Pain in left toe(s): Secondary | ICD-10-CM

## 2021-04-13 ENCOUNTER — Inpatient Hospital Stay: Admit: 2021-04-13 | Discharge: 2021-04-13 | Payer: PRIVATE HEALTH INSURANCE

## 2021-04-13 ENCOUNTER — Emergency Department: Admit: 2021-04-13 | Payer: PRIVATE HEALTH INSURANCE | Primary: Family Medicine

## 2021-04-13 ENCOUNTER — Encounter: Admit: 2021-04-13 | Payer: PRIVATE HEALTH INSURANCE | Primary: Family Medicine

## 2021-04-13 DIAGNOSIS — I10 Essential (primary) hypertension: Secondary | ICD-10-CM

## 2021-04-13 DIAGNOSIS — R109 Unspecified abdominal pain: Secondary | ICD-10-CM

## 2021-04-13 LAB — URINALYSIS WITH CULTURE REFLEX      (BH LMW YH)
BKR BILIRUBIN, UA MG/DL: NEGATIVE mg/dL
BKR BLOOD, UA: NEGATIVE
BKR GLUCOSE, UA MG/DL: NEGATIVE mg/dL
BKR KETONES, UA MG/DL: NEGATIVE mg/dL
BKR LEUKOCYTE ESTERASE, UA: NEGATIVE Leu/uL
BKR NITRITE, UA: NEGATIVE
BKR PH, UA: 6 (ref 5.0–7.0)
BKR PROTEIN, UA.: NEGATIVE mg/dL
BKR RBC/HPF INSTRUMENT: 1 /HPF (ref 0–3)
BKR SPECIFIC GRAVITY, UA: 1.01 (ref 1.003–1.035)
BKR URINE SQUAMOUS EPITHELIAL CELLS, UA (NUMERIC): 2 /HPF (ref 0–5)
BKR UROBILINOGEN, UA - ALPHA: NEGATIVE EU/dL
BKR WBC/HPF INSTRUMENT: 1 /HPF (ref 0–5)

## 2021-04-13 LAB — COMPREHENSIVE METABOLIC PANEL
BKR ALANINE AMINOTRANSFERASE (ALT): 16 U/L (ref 12–78)
BKR ALBUMIN: 4.2 g/dL (ref 3.4–5.0)
BKR ALKALINE PHOSPHATASE: 88 U/L (ref 45–117)
BKR ANION GAP (LM): 6 mmol/L (ref 5–15)
BKR ASPARTATE AMINOTRANSFERASE (AST): 16 U/L (ref 15–37)
BKR BILIRUBIN TOTAL: 0.4 mg/dL (ref 0.2–1.0)
BKR BLOOD UREA NITROGEN: 8 mg/dL (ref 7–18)
BKR CALCIUM: 9.5 mg/dL (ref 8.5–10.1)
BKR CHLORIDE: 101 mmol/L (ref 98–107)
BKR CO2: 30 mmol/L (ref 21–32)
BKR CREATININE: 0.96 mg/dL (ref 0.55–1.02)
BKR EGFR (AFR AMER) (LMC): >60 mL/min/{1.73_m2} (ref >60)
BKR EGFR (NON AFR AMER) (LMC): >60 mL/min/{1.73_m2} (ref >60)
BKR GLOBULIN: 4.2 g/dL (ref 2.5–5.0)
BKR GLUCOSE: 95 mg/dL (ref 65–110)
BKR POTASSIUM: 3.8 mmol/L (ref 3.5–5.1)
BKR PROTEIN TOTAL: 8.4 g/dL — ABNORMAL HIGH (ref 6.4–8.2)
BKR SODIUM: 137 mmol/L (ref 136–145)

## 2021-04-13 LAB — CBC WITH AUTO DIFFERENTIAL
BKR WAM ABSOLUTE IMMATURE GRANULOCYTES.: 0.02 x 1000/ÂµL (ref 0.00–0.30)
BKR WAM ABSOLUTE LYMPHOCYTE COUNT.: 2.2 x 1000/ÂµL (ref 0.60–3.70)
BKR WAM ABSOLUTE NEUTROPHIL COUNT.: 1.89 x 1000/ÂµL — ABNORMAL LOW (ref 2.00–7.60)
BKR WAM BASOPHIL ABSOLUTE COUNT.: 0.06 x 1000/ÂµL (ref 0.00–1.00)
BKR WAM BASOPHILS: 1.3 % (ref 0.0–1.4)
BKR WAM EOSINOPHIL ABSOLUTE COUNT.: 0.08 x 1000/ÂµL (ref 0.00–1.00)
BKR WAM EOSINOPHILS: 1.7 % (ref 0.0–5.0)
BKR WAM HEMATOCRIT (2 DEC): 41.6 % (ref 35.00–45.00)
BKR WAM HEMOGLOBIN: 13.8 g/dL (ref 11.7–15.5)
BKR WAM IMMATURE GRANULOCYTES: 0.4 % (ref 0.0–1.0)
BKR WAM LYMPHOCYTES: 46.8 % (ref 17.0–50.0)
BKR WAM MCH PG: 28.2 pg (ref 27.0–33.0)
BKR WAM MCHC: 33.2 g/dL (ref 31.0–36.0)
BKR WAM MCV: 85.1 fL (ref 80.0–100.0)
BKR WAM MONOCYTE ABSOLUTE COUNT.: 0.45 x 1000/ÂµL (ref 0.00–1.00)
BKR WAM MONOCYTES: 9.6 % (ref 4.0–12.0)
BKR WAM MPV: 9.4 fL (ref 8.0–12.0)
BKR WAM NEUTROPHILS: 40.2 % (ref 39.0–72.0)
BKR WAM NUCLEATED RED BLOOD CELLS: 0 % (ref 0.0–1.0)
BKR WAM PLATELETS: 295 x1000/ÂµL (ref 150–420)
BKR WAM RDW-CV: 13.1 % (ref 11.0–15.0)
BKR WAM RED BLOOD CELL COUNT.: 4.89 M/ÂµL (ref 4.00–6.00)
BKR WAM WHITE BLOOD CELL COUNT: 4.7 x1000/ÂµL (ref 4.0–11.0)

## 2021-04-13 LAB — UA REFLEX CULTURE

## 2021-04-13 LAB — HCG, URINE, QUALITATIVE     (BH GH LMW Q): BKR PREGNANCY TEST URINE: NEGATIVE

## 2021-04-13 MED ORDER — MORPHINE 4 MG/ML INTRAVENOUS SOLUTION
4 mg/mL | Freq: Once | INTRAVENOUS | Status: CP
Start: 2021-04-13 — End: ?
  Administered 2021-04-13: 19:00:00 4 mL via INTRAVENOUS

## 2021-04-13 MED ORDER — SODIUM CHLORIDE 0.9 % (FLUSH) INJECTION SYRINGE
0.9 % | INTRAVENOUS | Status: DC | PRN
Start: 2021-04-13 — End: 2021-04-14

## 2021-04-13 MED ORDER — HYDROCHLOROTHIAZIDE 25 MG TABLET
25 mg | ORAL_TABLET | Freq: Every day | ORAL | 1 refills | Status: AC
Start: 2021-04-13 — End: ?

## 2021-04-13 MED ORDER — OXYCODONE-ACETAMINOPHEN 5 MG-325 MG TABLET
5-325 mg | ORAL_TABLET | Freq: Four times a day (QID) | ORAL | 1 refills | Status: AC | PRN
Start: 2021-04-13 — End: ?

## 2021-04-13 MED ORDER — DICYCLOMINE 20 MG TABLET
20 mg | ORAL_TABLET | Freq: Four times a day (QID) | ORAL | 1 refills | Status: AC | PRN
Start: 2021-04-13 — End: ?

## 2021-04-13 MED ORDER — METRONIDAZOLE 500 MG TABLET
500 mg | ORAL_TABLET | Freq: Three times a day (TID) | ORAL | 1 refills | Status: AC
Start: 2021-04-13 — End: ?

## 2021-04-13 MED ORDER — IOHEXOL 350 MG IODINE/ML INTRAVENOUS SOLUTION
350 mg iodine/mL | Freq: Once | INTRAVENOUS | Status: CP | PRN
Start: 2021-04-13 — End: ?
  Administered 2021-04-13: 20:00:00 350 mL via INTRAVENOUS

## 2021-04-13 MED ORDER — ONDANSETRON HCL (PF) 4 MG/2 ML INJECTION SOLUTION
4 mg/2 mL | Freq: Once | INTRAVENOUS | Status: CP
Start: 2021-04-13 — End: ?
  Administered 2021-04-13: 19:00:00 4 mL via INTRAVENOUS

## 2021-04-13 MED ORDER — SODIUM CHLORIDE 0.9 % BOLUS (NEW BAG)
0.9 % | Freq: Once | INTRAVENOUS | Status: CP
Start: 2021-04-13 — End: ?
  Administered 2021-04-13: 20:00:00 0.9 mL/h via INTRAVENOUS

## 2021-04-13 NOTE — Discharge Instructions
Complete antibiotics as prescribed. Use pain medication as needed. Follow-up with GI and OBGYN

## 2021-04-13 NOTE — ED Provider Notes
CHIEF COMPLAINTFlank pain HPITonya Morales is a 45 y.o. female who presents to the ED c/o left flank pain and left groin pain.  She would also like me to refill her regular medications.  Past medical history of IBS.Left flank symptoms have been present for about 1 week.  She has had normal bowel movements.  No UTI symptoms.  She has had a total hysterectomy.  Denies any vaginal discharge.  She did have sexual intercourse with her husband on Saturday and reports no vaginal pain.No fevers chills.  No vomiting or diarrhea.  Appetite has been normal.?I have reviewed the following from the nursing documentation. Past Medical History: Diagnosis Date ? Hypertension  No past surgical history on file.History reviewed. No pertinent family history.Social History Socioeconomic History ? Marital status: Married   Spouse name: Not on file ? Number of children: Not on file ? Years of education: Not on file ? Highest education level: Not on file Occupational History ? Not on file Tobacco Use ? Smoking status: Never Smoker ? Smokeless tobacco: Never Used Substance and Sexual Activity ? Alcohol use: Not on file ? Drug use: Not on file ? Sexual activity: Not on file Other Topics Concern ? Not on file Social History Narrative ? Not on file Social Determinants of Health Financial Resource Strain: Not on file Food Insecurity: Not on file Transportation Needs: Not on file Physical Activity: Not on file Stress: Not on file Social Connections: Not on file Intimate Partner Violence: Not on file Housing Stability: Not on file Current Facility-Administered Medications Medication Dose Route Frequency Provider Last Rate Last Admin ? sodium chloride 0.9 % flush 10 mL  10 mL Intravenous PRN for [facility] Damien Fusi, [place]     Current Outpatient Medications Medication Sig Dispense Refill ? dicyclomine (BENTYL) 20 mg tablet Take 1 tablet (20 mg total) by mouth every 6 (six) hours as needed. 56 tablet 0 ? hydroCHLOROthiazide (HYDRODIURIL) 25 mg tablet Take 1 tablet (25 mg total) by mouth daily. 30 tablet 0 ? metroNIDAZOLE (FLAGYL) 500 mg tablet Take 1 tablet (500 mg total) by mouth 3 (three) times daily for 7 days. 21 tablet 0 ? oxyCODONE-acetaminophen (PERCOCET) 5-325 mg per tablet Take 1 tablet by mouth every 6 (six) hours as needed for pain for up to 3 days. 6 tablet 0 No Known AllergiesROSPositives and pertinent negatives were reviewed in the HPI. All other systems were reviewed and are otherwise negative.PHYSICAL EXAMBP (!) 164/117  - Pulse 76  - Temp 97.9 ?F (36.6 ?C) (Temporal)  - Resp 18  - Wt 79.3 kg  - SpO2 99%  GENERAL APPEARANCE: The patient is a 45 y.o. female in no acute distress, non-toxic.HEENT: Normocephalic. Atraumatic. Sclera clear. Mucous membranes pink and moist. No facial asymmetry. NECK:Neck is supple with full range of motion. No Meningismus. CARDIOVASCULAR: Good capillary refill with no evidence of cyanosis. Regular rate and rhythm.  LUNGS: Respirations unlabored. Speaking comfortably in full sentences.  ABDOMEN: Soft,  non-distended.  There is tenderness to palpation in the left lower quadrant.  MUSCULOSKELETAL: Atraumatic x4. Moves extremities freely. No peripheral edema. SKIN:Warm pink and dry.NEUROLOGICAL: Alert and oriented x4 with no gross motor or sensory deficits. PSYCHIATRIC:Calm and pleasant affect, cooperative and interactive with staff. LABSI have reviewed all available labs for this visit prior to discharge.Results for orders placed or performed during the hospital encounter of 04/13/21 UA reflex to culture  Specimen: Urine Result Value Ref Range  Reflex Urine Culture See Comment  Urinalysis with culture reflex     (  BH LMW [facility])  Specimen: Urine Result Value Ref Range  Color, UA Yellow Yellow  Clarity, UA Clear Clear pH, UA 6.0 5.0 - 7.0  Specific Gravity, UA 1.010 1.003 - 1.035  Protein, UA Negative Negative mg/dL  Nitrite, UA Negative Negative  Glucose, UA Negative Negative mg/dL  Leukocytes, UA Negative Negative Leu/uL  Blood, UA Negative Negative  Ketones, UA Negative Negative mg/dL  Bilirubin, UA Negative Negative mg/dL  Urobilinogen, UA Negative Negative EU/dL  RBC/HPF, UA 1 0 - 3 /HPF  WBC/HPF, UA 1 0 - 5 /HPF  Urine Squamous Epithelial Cells, UA 2 0 - 5 /HPF  Reflex   HCG, urine, qualitative Result Value Ref Range  Preg Test, Urine Negative Negative CBC auto differential Result Value Ref Range  WBC 4.7 4.0 - 11.0 x1000/?L  RBC 4.89 4.00 - 6.00 M/?L  Hemoglobin 13.8 11.7 - 15.5 g/dL  Hematocrit 16.10 96.04 - 45.00 %  MCV 85.1 80.0 - 100.0 fL  MCH 28.2 27.0 - 33.0 pg  MCHC 33.2 31.0 - 36.0 g/dL  RDW-CV 54.0 98.1 - 19.1 %  Platelets 295 150 - 420 x1000/?L  MPV 9.4 8.0 - 12.0 fL  Neutrophils 40.2 39.0 - 72.0 %  Lymphocytes 46.8 17.0 - 50.0 %  Monocytes 9.6 4.0 - 12.0 %  Eosinophils 1.7 0.0 - 5.0 %  Basophil 1.3 0.0 - 1.4 %  Immature Granulocytes 0.4 0.0 - 1.0 %  nRBC 0.0 0.0 - 1.0 %  ANC (Abs Neutrophil Count) 1.89 (L) 2.00 - 7.60 x 1000/?L  Absolute Lymphocyte Count 2.20 0.60 - 3.70 x 1000/?L  Monocyte Absolute Count 0.45 0.00 - 1.00 x 1000/?L  Eosinophil Absolute Count 0.08 0.00 - 1.00 x 1000/?L  Basophil Absolute Count 0.06 0.00 - 1.00 x 1000/?L  Absolute Immature Granulocyte Count 0.02 0.00 - 0.30 x 1000/?L Comprehensive metabolic panel Result Value Ref Range  Sodium 137 136 - 145 mmol/L  Potassium 3.8 3.5 - 5.1 mmol/L  Chloride 101 98 - 107 mmol/L  CO2 30 21 - 32 mmol/L  Anion Gap 6 5 - 15 mmol/L  Glucose 95 65 - 110 mg/dL  BUN 8 7 - 18 mg/dL  Creatinine 4.78 2.95 - 1.02 mg/dL  eGFR (AFRICAN-AMERICAN) >60 >60 mL/min/1.18m2  eGFR (NON African-American) >60 >60 mL/min/1.20m2  Calcium 9.5 8.5 - 10.1 mg/dL  Total Protein 8.4 (H) 6.4 - 8.2 g/dL  Albumin 4.2 3.4 - 5.0 g/dL  Globulin 4.2 2.5 - 5.0 g/dL  Total Bilirubin 0.4 0.2 - 1.0 mg/dL  Alkaline Phosphatase 88 45 - 117 U/L  Alanine Aminotransferase (ALT) 16 12 - 78 U/L  Aspartate Aminotransferase (AST) 16 15 - 37 U/L RADIOLOGYUS Non-OB TransvaginalResult Date: 7/6/2022US NON-OB TRANSVAGINAL HISTORY: free pelvic fluid? vaginal wall inflammation on [place]. COMPARISON: [place] scan abdomen/pelvis same day TECHNIQUE: Transvaginal high resolution sonographic images were obtained utilizing gray scale imaging. FINDINGS: The uterus is surgically absent, correlating with the [place] scan findings. The ovaries could not be visualized. The pelvic fluid seen on the [place] scan is not visualized.  Nonvisualization of pelvic fluid seen on [place] scan. Nonvisualization of ovaries. Reported and Signed by:  Jolyn Lent, MD  Abdomen Pelvis w IV ContrastResult Date: 7/6/2022CT ABDOMEN PELVIS W IV CONTRAST  HISTORY: LLQ abdominal pain. COMPARISON: None. A2130 - Total number of known  scans and cardiac nuclear medicine studies within the past 12 months: 1 Q6578 - RADIATION DOSE ACQUIRED DURING SCAN: 275.15 mGy.cm. The [employer] strives for high quality imaging with the lowest  possible radiation dose. For more information on medical radiation exposure please visit: [email or web address] . TECHNIQUE: Contiguous axial images of the abdomen and pelvis were obtained after the administration of intravenous contrast (75 cc Omnipaque 350). Oral contrast was not administered.  Sagittal and coronal reformatted images were also provided. FINDINGS: The visualized portions of the lung bases are unremarkable.  The visualized heart base is not enlarged. The abdominal aorta is not enlarged. The liver, gallbladder, biliary tree, spleen, splenule, pancreas, adrenal glands, and kidneys are unremarkable. The urinary bladder wall appears mildly thickened. The uterus is absent. There is haziness of the fat around the distal vaginal/cervical region. The ovaries do not appear enlarged. Minor fluid layers in the pelvis.   Assessment of the bowel is overall limited by the absence of enteric contrast.  There is no finding of bowel obstruction. There is no appreciable bowel wall thickening. The appendix is not enlarged. Spine degenerative changes are noted. Mild urinary bladder wall thickening. Correlate for cystitis. Surgically absent uterus. Haziness of fat along distal vaginal/cervical region. Correlate for associated infectious symptomatology. Nonspecific minor fluid layering in pelvis which could be physiologic or reactive. Reported and Signed by:  Jolyn Lent, MD MDM/ED COURSE:The patient presents c/o left flank pain for about 1 week.Vital signs are stable.  Exam is generally reassuring, though she does have reproducible left lower quadrant abdominal pain.  Labs not reveal any acute leukocytosis or other findings however due to physical exam findings she was sent for [place] of the abdomen pelvis which showed bladder wall thickening possible cystitis, urine did not suggest this and she has no UTI symptoms, uterus is surgically absent, there is haziness of the fat distal to the vaginal/cervical region and some minor fluid in the pelvis.  This was further evaluated with ultrasound which was reassuring.  Patient will be discharged home with a prescription for Flagyl, gyn follow-up, she requested that I refill her hydrochlorothiazide and Bentyl and she will be referred to local GI she is new to the area.  Relevant old records, labs and imaging are reviewed. The patient was stable during the ED course. During the patient's ED course, the patient was given:Medications sodium chloride 0.9 % flush 10 mL (has no administration in time range) ondansetron (PF) (ZOFRAN) injection 4 mg (4 mg IV Push Given 04/13/21 1517) morphine injection 4 mg (4 mg IV Push Given 04/13/21 1518) iohexoL (OMNIPAQUE) 350 mg iodine/mL injection 75 mL (75 mLs Intravenous Given 04/13/21 1541) sodium chloride 0.9 % (new bag) bolus 60 mL (0 mLs Intravenous Stopped 04/13/21 1553)  ?CLINICAL IMPRESSION  SNOMED [place](R) 1. Flank pain  FLANK PAIN 2. Pelvic inflammation in female  FEMALE PELVIC INFLAMMATORY DISEASE 3. Medication refill  REPEATED PRESCRIPTION ?DISPOSITIONTonya Morales was discharged to home in stable condition.Return precautions were discussed for any new/worsening symptoms with patient and/or family. I counseled patient how to take medications prescribed by me. There were no questions at discharge.There are no discharge medications for this patient.They have been instructed to follow-up with Verita Schneiders, [id number] [facility] [place] [phone], Tinnie Gens, MD[address] [place] [phone], Reuel Boom, [place] [address] [place] [phone]:I have evaluated this patient; My supervising physician was available for consultation.DISCLAIMER: This chart was created using M-Modal dictation software. Efforts were made by me to ensure accuracy, however some errors may be present due to limitations of this technology and occasionally words are not transcribed as intended.  Damien Fusi, PA07/06/22 1945

## 2021-04-14 DIAGNOSIS — I10 Essential (primary) hypertension: Secondary | ICD-10-CM

## 2021-04-14 DIAGNOSIS — K589 Irritable bowel syndrome without diarrhea: Secondary | ICD-10-CM

## 2021-04-14 DIAGNOSIS — N739 Female pelvic inflammatory disease, unspecified: Secondary | ICD-10-CM

## 2021-04-14 DIAGNOSIS — Z760 Encounter for issue of repeat prescription: Secondary | ICD-10-CM

## 2021-04-14 DIAGNOSIS — R109 Unspecified abdominal pain: Secondary | ICD-10-CM

## 2021-04-14 DIAGNOSIS — Z79899 Other long term (current) drug therapy: Secondary | ICD-10-CM

## 2021-04-14 LAB — HEMOGLOBIN A1C: BKR HEMOGLOBIN A1C: 5.8 % (ref 4.2–6.3)
